# Patient Record
Sex: Male | Born: 1955
Health system: Southern US, Community
[De-identification: ages and names within clinical notes are randomized; demographics above are authoritative.]

## PROBLEM LIST (undated history)

## (undated) DIAGNOSIS — I10 Essential (primary) hypertension: Secondary | ICD-10-CM

## (undated) HISTORY — DX: Essential (primary) hypertension: I10

## (undated) HISTORY — PX: OTHER SURGICAL HISTORY: SHX169

---

## 2007-02-08 ENCOUNTER — Ambulatory Visit: Payer: Self-pay | Admitting: Cardiology

## 2007-02-08 ENCOUNTER — Observation Stay (HOSPITAL_COMMUNITY): Admission: EM | Admit: 2007-02-08 | Discharge: 2007-02-09 | Payer: Self-pay | Admitting: Emergency Medicine

## 2007-02-09 ENCOUNTER — Ambulatory Visit: Payer: Self-pay | Admitting: Cardiology

## 2007-02-15 ENCOUNTER — Ambulatory Visit: Payer: Self-pay

## 2007-02-22 ENCOUNTER — Ambulatory Visit: Payer: Self-pay

## 2007-02-24 ENCOUNTER — Ambulatory Visit: Payer: Self-pay | Admitting: Cardiology

## 2007-02-24 LAB — CONVERTED CEMR LAB
CO2: 30 meq/L (ref 19–32)
Chloride: 100 meq/L (ref 96–112)
Creatinine, Ser: 1 mg/dL (ref 0.4–1.5)
Glucose, Bld: 108 mg/dL — ABNORMAL HIGH (ref 70–99)
Sodium: 138 meq/L (ref 135–145)

## 2007-05-19 ENCOUNTER — Ambulatory Visit: Payer: Self-pay | Admitting: Internal Medicine

## 2007-05-19 DIAGNOSIS — I10 Essential (primary) hypertension: Secondary | ICD-10-CM | POA: Insufficient documentation

## 2007-08-19 ENCOUNTER — Ambulatory Visit: Payer: Self-pay | Admitting: Gastroenterology

## 2007-09-01 ENCOUNTER — Encounter: Payer: Self-pay | Admitting: Internal Medicine

## 2007-09-01 ENCOUNTER — Ambulatory Visit: Payer: Self-pay | Admitting: Gastroenterology

## 2007-09-01 ENCOUNTER — Encounter: Payer: Self-pay | Admitting: Gastroenterology

## 2007-12-21 ENCOUNTER — Ambulatory Visit: Payer: Self-pay | Admitting: Internal Medicine

## 2007-12-21 LAB — CONVERTED CEMR LAB
ALT: 31 units/L (ref 0–53)
AST: 23 units/L (ref 0–37)
Basophils Absolute: 0 10*3/uL (ref 0.0–0.1)
Basophils Relative: 0.4 % (ref 0.0–1.0)
Bilirubin, Direct: 0.1 mg/dL (ref 0.0–0.3)
Blood in Urine, dipstick: NEGATIVE
CO2: 29 meq/L (ref 19–32)
Chloride: 100 meq/L (ref 96–112)
Cholesterol: 117 mg/dL (ref 0–200)
Creatinine, Ser: 1.1 mg/dL (ref 0.4–1.5)
Ketones, urine, test strip: NEGATIVE
LDL Cholesterol: 53 mg/dL (ref 0–99)
Lymphocytes Relative: 31.8 % (ref 12.0–46.0)
MCHC: 34.6 g/dL (ref 30.0–36.0)
Neutrophils Relative %: 57.4 % (ref 43.0–77.0)
Nitrite: NEGATIVE
PSA: 0.5 ng/mL (ref 0.10–4.00)
Protein, U semiquant: NEGATIVE
RBC: 4.78 M/uL (ref 4.22–5.81)
Total Bilirubin: 0.9 mg/dL (ref 0.3–1.2)
Total CHOL/HDL Ratio: 2.8
Urobilinogen, UA: 0.2
VLDL: 22 mg/dL (ref 0–40)
WBC: 5.6 10*3/uL (ref 4.5–10.5)

## 2007-12-28 ENCOUNTER — Ambulatory Visit: Payer: Self-pay | Admitting: Internal Medicine

## 2008-01-24 IMAGING — CT CT ANGIO CHEST
3 of 9 series · 15 of 36 positions shown · IV contrast (APPLIED)
Comparison: none

CLINICAL DATA: Chest pain, hypertension

[Series 7: pulm embolism 1.0 b25f thins · axial · 0.74mm/px · z∈[+1090,+1300]mm · 12 of 247 slices shown (1 of 2)]
[im 19/247  lung]
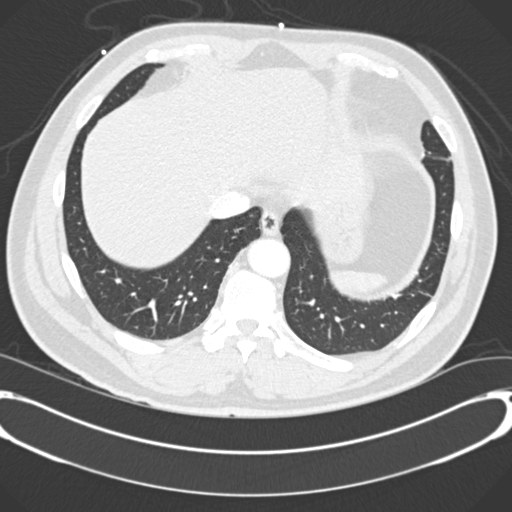
[im 38/247  mediastinal]
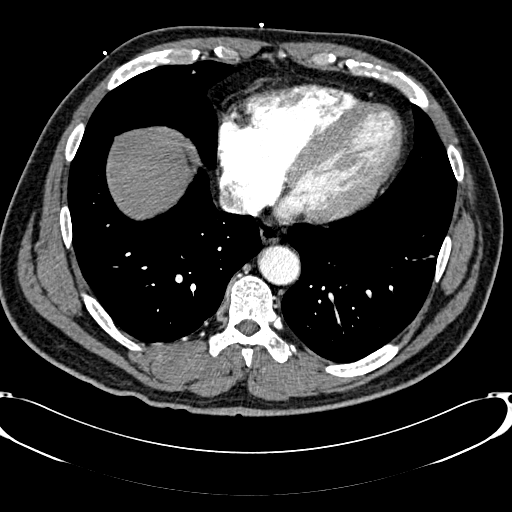
[im 57/247  lung]
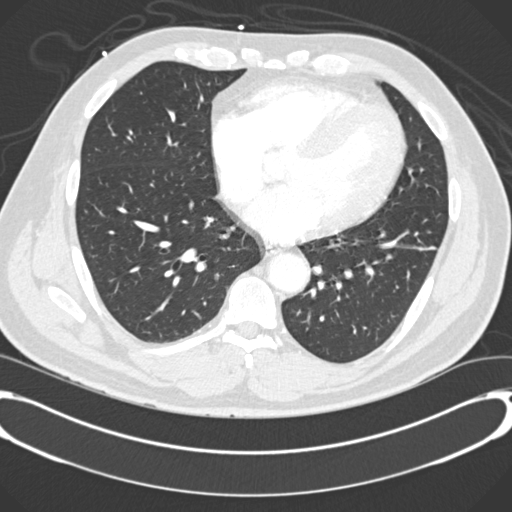
[im 76/247  mediastinal]
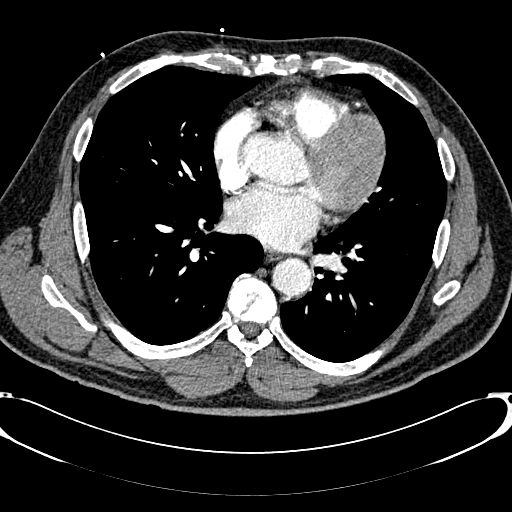
[im 95/247  lung]
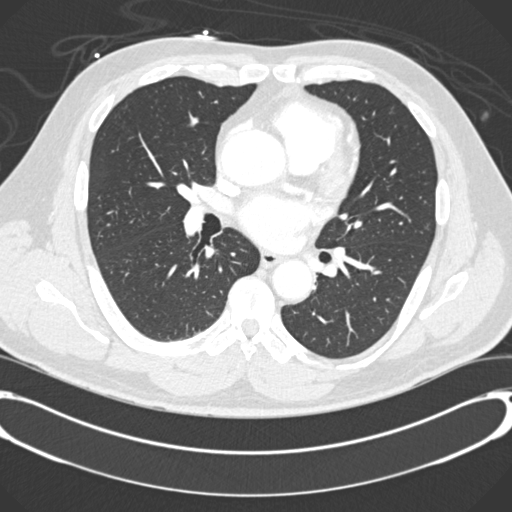
[im 114/247  mediastinal]
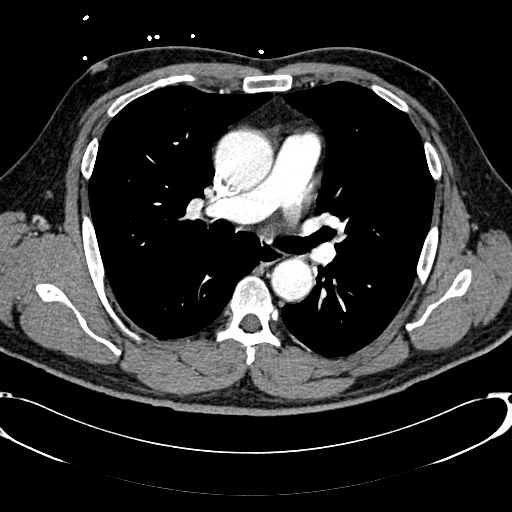
[im 133/247  lung]
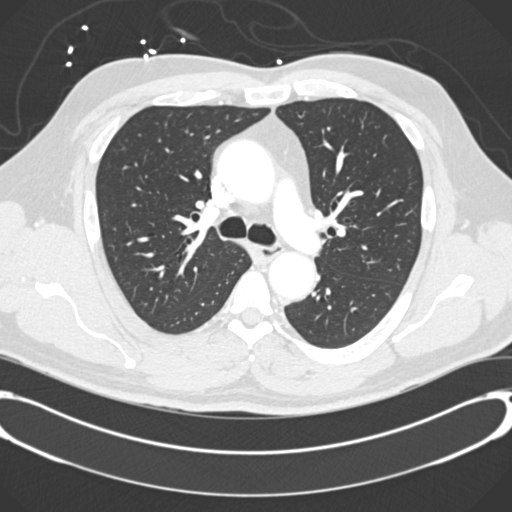
[im 152/247  mediastinal]
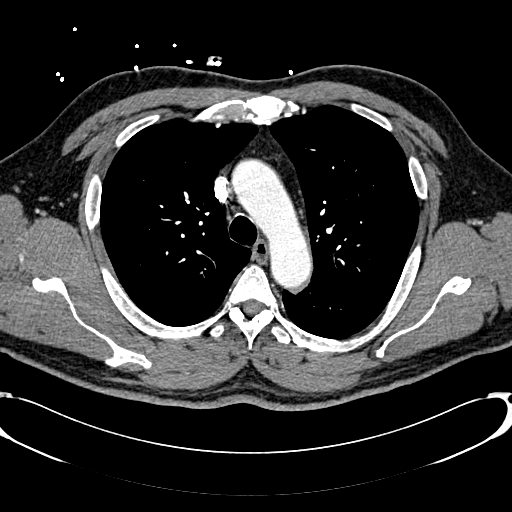
[im 171/247  lung]
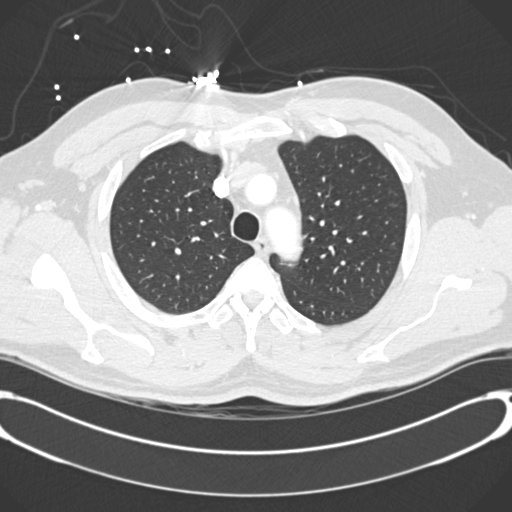
[im 190/247  mediastinal]
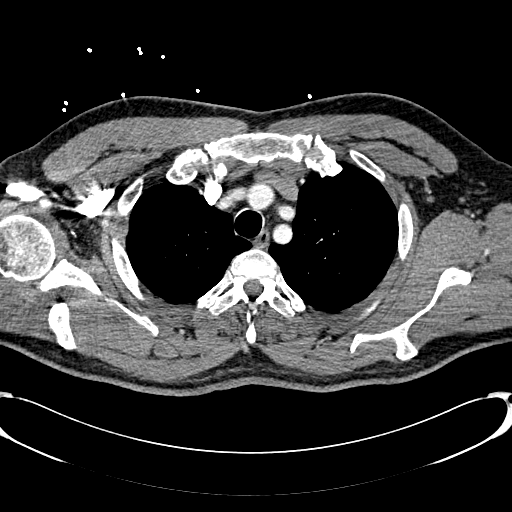
[im 209/247  lung]
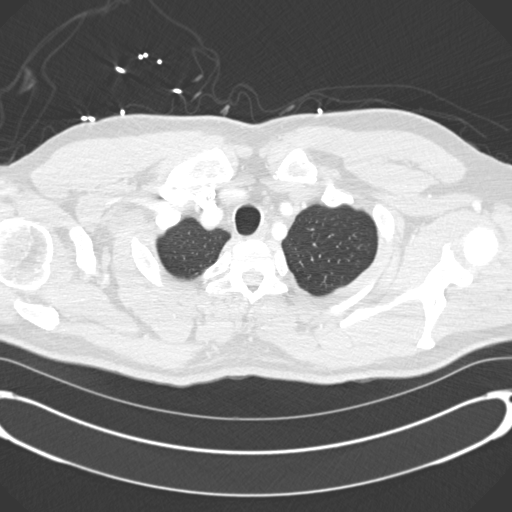
[im 228/247  mediastinal]
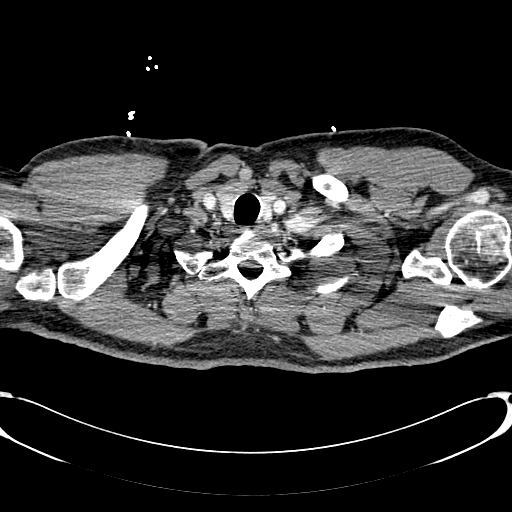

[Series 9: pulm embolism 1.0 b25f thins · axial · 0.74mm/px · z∈[+1062,+1084]mm · 2 of 68 slices shown (2 of 2)]
[im 23/68  lung]
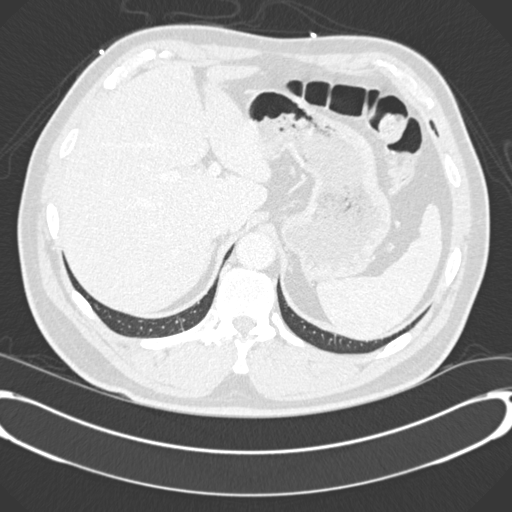
[im 45/68  lung]
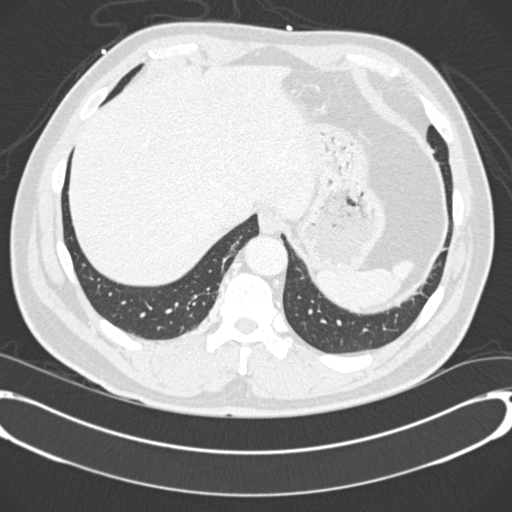

[Series 602: coronal · coronal · 0.74mm/px · 1 of 123 slices shown]
[im 62/123  mediastinal]
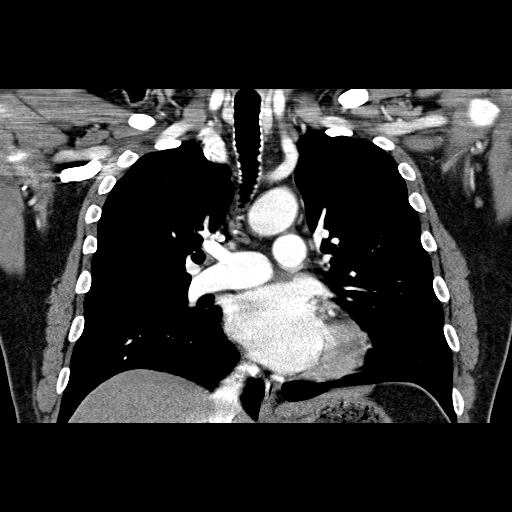

[15 of 36 positions shown; findings below may reference images not displayed]

CT angiogram chest with contrast:

Multidetector helical CT of the chest was obtained after 100 ml Omnipaque 300 
IV. CT multiplanar reconstructions were rendered to evaluate the vascular
anatomy.
No previous for comparison. Good contrast opacification of pulmonary artery
branches. No discrete filling defects to suggest acute PE. Good enhancement of
the thoracic aorta, without evidence of dissection or aneurysm. Ascending aorta
4.3 x 4.6 cm. No pleural or pericardial effusion. Proximal brachiocephalic
vessels unremarkable. No hilar or mediastinal adenopathy. Linear scarring or
atelectasis in the lateral basal segment left lower lobe. Lungs otherwise clear.
IMPRESSION: 1. Negative for acute PE or thoracic aortic dissection.
2. Linear scar or atelectasis in the left lower lobe.

## 2009-01-30 ENCOUNTER — Telehealth: Payer: Self-pay | Admitting: Internal Medicine

## 2009-03-08 ENCOUNTER — Ambulatory Visit: Payer: Self-pay | Admitting: Family Medicine

## 2009-03-13 ENCOUNTER — Ambulatory Visit: Payer: Self-pay | Admitting: Internal Medicine

## 2009-03-13 LAB — CONVERTED CEMR LAB
AST: 27 units/L (ref 0–37)
BUN: 18 mg/dL (ref 6–23)
Basophils Absolute: 0.1 10*3/uL (ref 0.0–0.1)
Bilirubin Urine: NEGATIVE
Blood in Urine, dipstick: NEGATIVE
CO2: 26 meq/L (ref 19–32)
Calcium: 9.6 mg/dL (ref 8.4–10.5)
Cholesterol: 110 mg/dL (ref 0–200)
Eosinophils Absolute: 0.2 10*3/uL (ref 0.0–0.7)
GFR calc non Af Amer: 93.16 mL/min (ref >60)
Glucose, Bld: 100 mg/dL — ABNORMAL HIGH (ref 70–99)
Glucose, Urine, Semiquant: NEGATIVE
HCT: 46.6 % (ref 39.0–52.0)
HDL: 49.1 mg/dL (ref >39.00)
Lymphs Abs: 2.1 10*3/uL (ref 0.7–4.0)
MCHC: 34.2 g/dL (ref 30.0–36.0)
Monocytes Relative: 7.9 % (ref 3.0–12.0)
PSA: 0.76 ng/mL (ref 0.10–4.00)
Platelets: 189 10*3/uL (ref 150.0–400.0)
Potassium: 4 meq/L (ref 3.5–5.1)
Protein, U semiquant: NEGATIVE
RDW: 12.4 % (ref 11.5–14.6)
TSH: 0.76 microintl units/mL (ref 0.35–5.50)
Total Bilirubin: 1.1 mg/dL (ref 0.3–1.2)
VLDL: 9.8 mg/dL (ref 0.0–40.0)
pH: 5

## 2009-03-20 ENCOUNTER — Ambulatory Visit: Payer: Self-pay | Admitting: Internal Medicine

## 2010-01-15 ENCOUNTER — Ambulatory Visit: Payer: Self-pay | Admitting: Internal Medicine

## 2010-01-18 LAB — CONVERTED CEMR LAB
ALT: 29 units/L (ref 0–53)
AST: 26 units/L (ref 0–37)
BUN: 17 mg/dL (ref 6–23)
Basophils Relative: 0.7 % (ref 0.0–3.0)
Chloride: 106 meq/L (ref 96–112)
Eosinophils Relative: 2 % (ref 0.0–5.0)
GFR calc non Af Amer: 80.38 mL/min (ref >60)
HCT: 45.1 % (ref 39.0–52.0)
Hemoglobin: 15.9 g/dL (ref 13.0–17.0)
Lymphs Abs: 1.6 10*3/uL (ref 0.7–4.0)
MCV: 92.9 fL (ref 78.0–100.0)
Monocytes Absolute: 0.5 10*3/uL (ref 0.1–1.0)
Monocytes Relative: 8 % (ref 3.0–12.0)
Neutro Abs: 4.2 10*3/uL (ref 1.4–7.7)
Potassium: 4.5 meq/L (ref 3.5–5.1)
RBC: 4.86 M/uL (ref 4.22–5.81)
Sed Rate: 3 mm/hr (ref 0–22)
Sodium: 141 meq/L (ref 135–145)
TSH: 1.13 microintl units/mL (ref 0.35–5.50)
Total Protein: 7.3 g/dL (ref 6.0–8.3)
WBC: 6.5 10*3/uL (ref 4.5–10.5)

## 2010-01-28 ENCOUNTER — Telehealth (INDEPENDENT_AMBULATORY_CARE_PROVIDER_SITE_OTHER): Payer: Self-pay | Admitting: *Deleted

## 2010-01-30 ENCOUNTER — Telehealth: Payer: Self-pay | Admitting: Internal Medicine

## 2010-01-30 LAB — CONVERTED CEMR LAB
LH: 2.94 milliintl units/mL (ref 1.50–9.30)
Prolactin: 5.8 ng/mL

## 2010-03-21 ENCOUNTER — Ambulatory Visit: Payer: Self-pay | Admitting: Internal Medicine

## 2010-03-21 DIAGNOSIS — E291 Testicular hypofunction: Secondary | ICD-10-CM | POA: Insufficient documentation

## 2010-06-13 ENCOUNTER — Ambulatory Visit: Payer: Self-pay | Admitting: Internal Medicine

## 2010-06-13 LAB — CONVERTED CEMR LAB: PSA: 0.58 ng/mL (ref 0.10–4.00)

## 2010-06-26 ENCOUNTER — Ambulatory Visit: Payer: Self-pay | Admitting: Internal Medicine

## 2010-08-06 NOTE — Assessment & Plan Note (Signed)
Summary: cpx/pt will come in fasting/njr   Vital Signs:  Patient profile:   55 year old male Height:      66.25 inches Weight:      205 pounds BMI:     32.96 Temp:     98.6 degrees F oral BP sitting:   168 / 96  (left arm) Cuff size:   regular  Vitals Entered By: Kern Reap CMA Duncan Dull) (March 21, 2010 7:59 AM)  Procedure Note Last Tetanus: Tdap (12/28/2007)  Skin Tag Removal:  Procedure # 1: skin tag removal    Region: inner thigh    # lesions removed: 1    Anesthesia: 1% lidocaine w/epinephrine    Comment: verbal consent  CC: cpx Is Patient Diabetic? No Pain Assessment Patient in pain? no        CC:  cpx.  History of Present Illness: cpx  Current Problems (verified): 1)  Preventive Health Care  (ICD-V70.0) 2)  Hypertension  (ICD-401.9)  Current Medications (verified): 1)  Adult Aspirin Low Strength 81 Mg  Tbdp (Aspirin) .... One By Mouth Daily 2)  Hydrochlorothiazide 25 Mg  Tabs (Hydrochlorothiazide) .... One By Mouth Daily 3)  Klor-Con M10 10 Meq  Tbcr (Potassium Chloride Crys Cr) .... One By Mouth Daily 4)  Ramipril 10 Mg  Caps (Ramipril) .... One By Mouth Daily 5)  Multivitamins  Tabs (Multiple Vitamin) .... Once Daily  Allergies: 1)  ! Penicillin V Potassium (Penicillin V Potassium)  Past History:  Past Medical History: Last updated: 05/19/2007 Hypertension  Past Surgical History: Last updated: 05/19/2007 Denies surgical history  Family History: Last updated: 03/20/2009 father - CABGx2 (smoker), lipids, AICD, DM (eventually deceased dementia age 27 yo) Family History Diabetes 1st degree relative Family History Hypertension mother with DM, htn  Social History: Last updated: 05/19/2007 Regular exercise-yes Never Smoked Alcohol use-yes Occupation:-[employer] Married 2nd wife 2 kids-healthy  Risk Factors: Alcohol Use: <1 (05/19/2007) Exercise: yes (05/19/2007)  Risk Factors: Smoking Status: never  (05/19/2007)  Review of Systems       Flu Vaccine Consent Questions     Do you have a history of severe allergic reactions to this vaccine? no    Any prior history of allergic reactions to egg and/or gelatin? no    Do you have a sensitivity to the preservative Thimersol? no    Do you have a past history of Guillan-Barre Syndrome? no    Do you currently have an acute febrile illness? no    Have you ever had a severe reaction to latex? no    Vaccine information given and explained to patient? yes    Are you currently pregnant? no    Lot Number:[id number]   Exp Date:01/04/2011   Site Given  Left Deltoid IM   Physical Exam  General:  alert and well-developed.   Head:  normocephalic and atraumatic.   Eyes:  pupils equal and pupils round.   Ears:  R ear normal and L ear normal.   Nose:  no external deformity and no external erythema.   Mouth:  pharynx pink and moist.   Neck:  supple and full ROM.   Chest Wall:  No deformities, masses, tenderness or gynecomastia noted. Lungs:  normal respiratory effort and no intercostal retractions.   Heart:  normal rate and regular rhythm.   Abdomen:  soft and non-tender.   Rectal:  no external abnormalities and normal sphincter tone.   Prostate:  no nodules and no asymmetry.   Msk:  No  deformity or scoliosis noted of thoracic or lumbar spine.   Extremities:  No clubbing, cyanosis, edema, or deformity noted  Neurologic:  cranial nerves II-XII intact and gait normal.   Skin:  large skin tag---iner thigh Cervical Nodes:  no anterior cervical adenopathy and no posterior cervical adenopathy.   Psych:  normally interactive and good eye contact.     Impression & Recommendations:  Problem # 1:  PREVENTIVE HEALTH CARE (ICD-V70.0) health maint UTD  Problem # 2:  HYPERTENSION (ICD-401.9) HOme BPs are well controlled 120-130/70s His updated medication list for this problem includes:    Hydrochlorothiazide 25 Mg Tabs (Hydrochlorothiazide) ..... One by  mouth daily    Ramipril 10 Mg Caps (Ramipril) ..... One by mouth daily  BP today: 168/96 Prior BP: 164/98 (01/15/2010)  Labs Reviewed: K+: 4.5 (01/15/2010) Creat: : 1.0 (01/15/2010)   Chol: 110 (03/13/2009)   HDL: 49.10 (03/13/2009)   LDL: 51 (03/13/2009)   TG: 49.0 (03/13/2009)  Problem # 3:  TESTICULAR HYPOFUNCTION (ICD-257.2) Assessment: New low testsosterone discussed replacement  Problem # 4:  skin tag Assessment: New  Complete Medication List: 1)  Adult Aspirin Low Strength 81 Mg Tbdp (Aspirin) .... One by mouth daily 2)  Hydrochlorothiazide 25 Mg Tabs (Hydrochlorothiazide) .... One by mouth daily 3)  Klor-con M10 10 Meq Tbcr (Potassium chloride crys cr) .... One by mouth daily 4)  Ramipril 10 Mg Caps (Ramipril) .... One by mouth daily 5)  Multivitamins Tabs (Multiple vitamin) .... Once daily 6)  Androgel Pump 20.25 Mg/act (1.62%) Gel (Testosterone) .... 2 pumps applied to shoulders daily  Other Orders: Admin 1st Vaccine (16109) Flu Vaccine 53yrs + (60454) Removal of Skin Tags (11200)   Patient Instructions: 1)  3 months labs only 2)  testosterone, psa prior Prescriptions: RAMIPRIL 10 MG  CAPS (RAMIPRIL) one by mouth daily  #90 x 0   Entered and Authorized by:   Birdie Sons MD   Signed by:   Birdie Sons MD on 03/21/2010   Method used:   [facility]  [place] [address] [facility]       [address]       [place], [place]  [address]       Ph: [phone] or [phone]       Fax: [phone]   RxID:   [id number] KLOR-CON M10 10 MEQ  TBCR (POTASSIUM CHLORIDE CRYS CR) one by mouth daily  #90 x 0   Entered and Authorized by:   Birdie Sons MD   Signed by:   Birdie Sons MD on 03/21/2010   Method used:   [facility]  [place] [address] [facility]       [address]       [place], [place]  [address]       Ph: [phone] or [phone]       Fax: [phone]   RxID:   [id number] HYDROCHLOROTHIAZIDE 25 MG  TABS  (HYDROCHLOROTHIAZIDE) one by mouth daily  #90 x 0   Entered and Authorized by:   Birdie Sons MD   Signed by:   Birdie Sons MD on 03/21/2010   Method used:   [facility]  [place] [address] [facility]       [address]       [place], [place]  [address]  Ph: [phone] or [phone]       Fax: [phone]   RxID:   [id number] ANDROGEL PUMP 20.25 MG/ACT (1.62%) GEL (TESTOSTERONE) 2 pumps applied to shoulders daily  #3 bottles x 1   Entered and Authorized by:   Birdie Sons MD   Signed by:   Birdie Sons MD on 03/21/2010   Method used:   Print then Give to Patient   RxID:   [id number]

## 2010-08-06 NOTE — Assessment & Plan Note (Signed)
Summary: MEDICATION CONCERNS // RS   Vital Signs:  Patient profile:   55 year old male Height:      66.5 inches (168.91 cm) Weight:      207.31 pounds (94.23 kg) BMI:     33.08 Temp:     98.8 degrees F (37.11 degrees C) oral Pulse rate:   88 / minute BP sitting:   164 / 98  (left arm) Cuff size:   regular  Vitals Entered By: Josph Macho RMA (January 15, 2010 9:54 AM) CC: Medication concern - ringing in ears, moody, joints achy/ CF Is Patient Diabetic? No   CC:  Medication concern - ringing in ears, moody, and joints achy/ CF.  History of Present Illness: pt  concerned with several unrelated issues notes some ringing in ears for 6 weeks wife states that his mood has been different. seems more distant and melancholy. couldn't enjoy a vacation at [facility] sex drive diminished also has noted slight chest 'blips" since mid-june. very brief episodes of a "sensation in my chest". No pain, no SOB. he has no exertional sxs.   he thinks a lot of his sxs, especially, "blips" are much better after exercise.   HTN--has been on meds for at least 2 years without previous side effects. Home BPs 120s/60s-130/80s  Current Medications (verified): 1)  Adult Aspirin Low Strength 81 Mg  Tbdp (Aspirin) .... One By Mouth Daily 2)  Hydrochlorothiazide 25 Mg  Tabs (Hydrochlorothiazide) .... One By Mouth Daily 3)  Klor-Con M10 10 Meq  Tbcr (Potassium Chloride Crys Cr) .... One By Mouth Daily 4)  Ramipril 10 Mg  Caps (Ramipril) .... One By Mouth Daily 5)  Multivitamins  Tabs (Multiple Vitamin) .... Once Daily  Allergies (verified): 1)  ! Penicillin V Potassium (Penicillin V Potassium)  Past History:  Past Medical History: Last updated: 05/19/2007 Hypertension  Past Surgical History: Last updated: 05/19/2007 Denies surgical history  Family History: Last updated: 03/20/2009 father - CABGx2 (smoker), lipids, AICD, DM (eventually deceased dementia age 73 yo) Family History Diabetes 1st  degree relative Family History Hypertension mother with DM, htn  Social History: Last updated: 05/19/2007 Regular exercise-yes Never Smoked Alcohol use-yes Occupation:-[employer] Married 2nd wife 2 kids-healthy  Risk Factors: Alcohol Use: <1 (05/19/2007) Exercise: yes (05/19/2007)  Risk Factors: Smoking Status: never (05/19/2007)  Physical Exam  General:  alert and well-developed.   Head:  normocephalic and atraumatic.   Neck:  supple and full ROM.   Lungs:  normal respiratory effort and no intercostal retractions.   Heart:  short 2/6 SEM Abdomen:  soft and non-tender.     Impression & Recommendations:  Problem # 1:  FATIGUE (ICD-780.79) ? cause, needs further eval check labs Orders: Venipuncture (57846) TLB-BMP (Basic Metabolic Panel-BMET) (80048-METABOL) TLB-CBC Platelet - w/Differential (85025-CBCD) TLB-Hepatic/Liver Function Pnl (80076-HEPATIC) TLB-TSH (Thyroid Stimulating Hormone) (84443-TSH) TLB-Sedimentation Rate (ESR) (85652-ESR) TLB-Testosterone, Total (84403-TESTO)  Complete Medication List: 1)  Adult Aspirin Low Strength 81 Mg Tbdp (Aspirin) .... One by mouth daily 2)  Hydrochlorothiazide 25 Mg Tabs (Hydrochlorothiazide) .... One by mouth daily 3)  Klor-con M10 10 Meq Tbcr (Potassium chloride crys cr) .... One by mouth daily 4)  Ramipril 10 Mg Caps (Ramipril) .... One by mouth daily 5)  Multivitamins Tabs (Multiple vitamin) .... Once daily

## 2010-08-06 NOTE — Progress Notes (Signed)
  Phone Note Outgoing Call   Call placed by: Rita Ohara Call placed to: Patient Summary of Call: Left a message with patient to call back. need to let him know he needs to come in to get prolactin FSH and LH redrawn. I faxed add on to [facility] on 01-16-10 and recived a confirmation back that the fax whent through but they never did the tests.

## 2010-08-06 NOTE — Progress Notes (Signed)
  Phone Note Call from Patient   Summary of Call: Patient called back and said he would come in today for labs to be drawn. Initial call taken by: Rita Ohara     Appended Document:  Patient came in and I sent the labs to [facility] STAT. I Elvina Sidle about this so she will be looking for results to notify patient.  Appended Document:  patient is aware that add on labs were normal.  patient would like to know what he should do next?  Appended Document:  call patient. testosterone low schedule non urgent OV to discuss replacement  Appended Document:  patient aware

## 2010-08-06 NOTE — Progress Notes (Signed)
Summary: Celexa CNCLD  Phone Note Call from Patient   Summary of Call: patient is calling because he has changed his mind about trying the celexa. Initial call taken by: Kern Reap CMA Duncan Dull),  January 30, 2010 9:53 AM  Follow-up for Phone Call        [facility] Follow-up by: Kern Reap CMA Duncan Dull),  January 30, 2010 9:54 AM

## 2010-08-06 NOTE — Progress Notes (Signed)
Summary: new rx and refills  Phone Note Outgoing Call   Call placed by: Kern Reap CMA Duncan Dull),  January 30, 2010 8:56 AM Call placed to: Patient Action Taken: Phone Call Completed Summary of Call: patient will need refills and rx for celexa called in until his cpx. Initial call taken by: Kern Reap CMA Duncan Dull),  January 30, 2010 8:56 AM    New/Updated Medications: CELEXA 10 MG TABS (CITALOPRAM HYDROBROMIDE) take one tab by mouth once daily Prescriptions: RAMIPRIL 10 MG  CAPS (RAMIPRIL) one by mouth daily  #90 x 0   Entered by:   Kern Reap CMA (AAMA)   Authorized by:   Birdie Sons MD   Signed by:   Kern Reap CMA (AAMA) on 01/30/2010   Method used:   [facility]  [place] [address] [facility]       [address]       [place], [place]  [address]       Ph: [phone] or [phone]       Fax: [phone]   RxID:   [id number] KLOR-CON M10 10 MEQ  TBCR (POTASSIUM CHLORIDE CRYS CR) one by mouth daily  #90 x 0   Entered by:   Kern Reap CMA (AAMA)   Authorized by:   Birdie Sons MD   Signed by:   Kern Reap CMA (AAMA) on 01/30/2010   Method used:   [facility]        [facility]  [place] [address] [facility]       [address]       [place], [place]  [address]       Ph: [phone] or [phone]       Fax: [phone]   RxID:   [id number] HYDROCHLOROTHIAZIDE 25 MG  TABS (HYDROCHLOROTHIAZIDE) one by mouth daily  #90 x 0   Entered by:   Kern Reap CMA (AAMA)   Authorized by:   Birdie Sons MD   Signed by:   Kern Reap CMA (AAMA) on 01/30/2010   Method used:   [facility]  [place] [address] [facility]       [address]       [place], [place]  [address]       Ph: [phone] or [phone]       Fax: [phone]   RxID:   [id number] CELEXA 10 MG TABS (CITALOPRAM HYDROBROMIDE) take one tab by mouth once daily  #30 x 3   Entered by:   Kern Reap CMA (AAMA)   Authorized by:   Birdie Sons MD   Signed by:   Kern Reap CMA (AAMA) on 01/30/2010   Method used:   [facility]        [facility]  [place] [address] [facility]       [address]       [place], [place]  [address]       Ph: [phone] or [phone]       Fax: [phone]   RxID:   [id number]

## 2010-08-08 NOTE — Assessment & Plan Note (Signed)
Summary: 3 month fup//ccm   Vital Signs:  Patient profile:   55 year old male Weight:      211 pounds Temp:     98.6 degrees F oral Pulse rate:   84 / minute Pulse rhythm:   regular BP sitting:   172 / 90  (left arm) Cuff size:   large  Vitals Entered By: Alfred Levins, CMA (June 26, 2010 8:06 AM) CC: discuss lab results   CC:  discuss lab results.  History of Present Illness:  Follow-Up Visit      This is a 55 year old man who presents for Follow-up visit.  The patient denies chest pain and palpitations.  Since the last visit the patient notes no new problems or concerns.  The patient reports taking meds as prescribed.  When questioned about possible medication side effects, the patient notes none. pt feels better on androgel  All other systems reviewed and were negative   Current Problems (verified): 1)  Testicular Hypofunction  (ICD-257.2) 2)  Preventive Health Care  (ICD-V70.0) 3)  Hypertension  (ICD-401.9)  Current Medications (verified): 1)  Adult Aspirin Low Strength 81 Mg  Tbdp (Aspirin) .... One By Mouth Daily 2)  Hydrochlorothiazide 25 Mg  Tabs (Hydrochlorothiazide) .... One By Mouth Daily 3)  Klor-Con M10 10 Meq  Tbcr (Potassium Chloride Crys Cr) .... One By Mouth Daily 4)  Ramipril 10 Mg  Caps (Ramipril) .... One By Mouth Daily 5)  Multivitamins  Tabs (Multiple Vitamin) .... Once Daily 6)  Androgel Pump 20.25 Mg/act (1.62%) Gel (Testosterone) .... 2 Pumps Applied To Shoulders Daily  Allergies (verified): 1)  ! Penicillin V Potassium (Penicillin V Potassium)  Past History:  Past Medical History: Last updated: 05/19/2007 Hypertension  Past Surgical History: Last updated: 05/19/2007 Denies surgical history  Family History: Last updated: 03/20/2009 father - CABGx2 (smoker), lipids, AICD, DM (eventually deceased dementia age 72 yo) Family History Diabetes 1st degree relative Family History Hypertension mother with DM, htn  Social History: Last  updated: 05/19/2007 Regular exercise-yes Never Smoked Alcohol use-yes Occupation:-[employer] Married 2nd wife 2 kids-healthy  Risk Factors: Alcohol Use: <1 (05/19/2007) Exercise: yes (05/19/2007)  Risk Factors: Smoking Status: never (05/19/2007)  Physical Exam  General:  alert and well-developed.   Head:  normocephalic and atraumatic.   Neck:  supple and full ROM.   Lungs:  normal respiratory effort and no intercostal retractions.   Skin:  turgor normal and color normal.   Psych:  normally interactive and good eye contact.     Impression & Recommendations:  Problem # 1:  TESTICULAR HYPOFUNCTION (ICD-257.2) see testosterone after a lot of discussion and phone calls it turns out he was given wong dose of androgel discussed with pt and pharmacy  repeat labs in 3 months  Problem # 2:  HYPERTENSION (ICD-401.9) he will monitor at home His updated medication list for this problem includes:    Hydrochlorothiazide 25 Mg Tabs (Hydrochlorothiazide) ..... One by mouth daily    Ramipril 10 Mg Caps (Ramipril) ..... One by mouth daily  BP today: 172/90 Prior BP: 168/96 (03/21/2010)  Labs Reviewed: K+: 4.5 (01/15/2010) Creat: : 1.0 (01/15/2010)   Chol: 110 (03/13/2009)   HDL: 49.10 (03/13/2009)   LDL: 51 (03/13/2009)   TG: 49.0 (03/13/2009)  Complete Medication List: 1)  Adult Aspirin Low Strength 81 Mg Tbdp (Aspirin) .... One by mouth daily 2)  Hydrochlorothiazide 25 Mg Tabs (Hydrochlorothiazide) .... One by mouth daily 3)  Klor-con M10 10 Meq Tbcr (  Potassium chloride crys cr) .... One by mouth daily 4)  Ramipril 10 Mg Caps (Ramipril) .... One by mouth daily 5)  Multivitamins Tabs (Multiple vitamin) .... Once daily 6)  Androgel Pump 20.25 Mg/act (1.62%) Gel (Testosterone) .... 2 pumps applied to shoulders daily  Patient Instructions: 1)  Please schedule a follow-up appointment in 3 months. labs only 2)  testosterone level--- Prescriptions: ANDROGEL PUMP 20.25  MG/ACT (1.62%) GEL (TESTOSTERONE) 2 pumps applied to shoulders daily  #3 bottles x 1   Entered and Authorized by:   Birdie Sons MD   Signed by:   Birdie Sons MD on 06/26/2010   Method used:   Reprint   RxID:   [id number]    Orders Added: 1)  Est. Patient Level IV [62[address]]

## 2010-09-09 ENCOUNTER — Other Ambulatory Visit: Payer: Self-pay | Admitting: *Deleted

## 2010-09-09 DIAGNOSIS — I10 Essential (primary) hypertension: Secondary | ICD-10-CM

## 2010-09-09 MED ORDER — POTASSIUM CHLORIDE 10 MEQ PO TBCR: 10.0000 meq | EXTENDED_RELEASE_TABLET | Freq: Every day | ORAL | Status: DC

## 2010-09-09 MED ORDER — HYDROCHLOROTHIAZIDE 25 MG PO TABS: 25.0000 mg | ORAL_TABLET | Freq: Every day | ORAL | Status: DC

## 2010-09-09 MED ORDER — RAMIPRIL 10 MG PO CAPS: 10.0000 mg | ORAL_CAPSULE | Freq: Every day | ORAL | Status: DC

## 2010-09-10 ENCOUNTER — Other Ambulatory Visit: Payer: Self-pay | Admitting: Internal Medicine

## 2010-09-10 DIAGNOSIS — I10 Essential (primary) hypertension: Secondary | ICD-10-CM

## 2010-09-10 MED ORDER — RAMIPRIL 10 MG PO CAPS: 10.0000 mg | ORAL_CAPSULE | Freq: Every day | ORAL | Status: DC

## 2010-09-10 MED ORDER — HYDROCHLOROTHIAZIDE 25 MG PO TABS: 25.0000 mg | ORAL_TABLET | Freq: Every day | ORAL | Status: DC

## 2010-09-10 MED ORDER — POTASSIUM CHLORIDE 10 MEQ PO TBCR: 10.0000 meq | EXTENDED_RELEASE_TABLET | Freq: Every day | ORAL | Status: DC

## 2010-09-25 ENCOUNTER — Other Ambulatory Visit (INDEPENDENT_AMBULATORY_CARE_PROVIDER_SITE_OTHER): Payer: PRIVATE HEALTH INSURANCE | Admitting: Internal Medicine

## 2010-09-25 DIAGNOSIS — E291 Testicular hypofunction: Secondary | ICD-10-CM

## 2010-09-25 LAB — TESTOSTERONE: Testosterone: 1057.23 ng/dL — ABNORMAL HIGH (ref 350.00–890.00)

## 2010-09-26 ENCOUNTER — Other Ambulatory Visit: Payer: Self-pay | Admitting: *Deleted

## 2010-09-26 DIAGNOSIS — E291 Testicular hypofunction: Secondary | ICD-10-CM

## 2010-09-26 MED ORDER — TESTOSTERONE 20.25 MG/ACT (1.62%) TD GEL: 2.0000 | Freq: Every day | TRANSDERMAL | Status: DC

## 2010-11-11 ENCOUNTER — Telehealth: Payer: Self-pay | Admitting: *Deleted

## 2010-11-11 MED ORDER — TADALAFIL 20 MG PO TABS: 20.0000 mg | ORAL_TABLET | Freq: Every day | ORAL | Status: AC | PRN

## 2010-11-11 NOTE — Telephone Encounter (Signed)
Pt is still having problems with ED, and wants to try one of the meds for this.  His BP is staying normal.  He is referring to Viagra, Cialis, etc.

## 2010-11-11 NOTE — Telephone Encounter (Signed)
cialis 20 mg po qod prn  30 minutes to 36 hours before intercourse.

## 2010-11-11 NOTE — Telephone Encounter (Signed)
Pt.notified

## 2010-11-19 NOTE — Discharge Summary (Signed)
NAMECASEY, FYE                 ACCOUNT NO.:  [id number]   MEDICAL RECORD NO.:  [id number]          PATIENT TYPE:  OBV   LOCATION:  4741                         FACILITY:  [facility]   PHYSICIAN:  Jonelle Sidle, MD DATE OF BIRTH:  February 22, 1956   DATE OF ADMISSION:  02/08/2007  DATE OF DISCHARGE:  02/09/2007                               DISCHARGE SUMMARY   Primary cardiologist, Dr. Simona Huh.   Primary care physician, [facility].   DISCHARGE DIAGNOSES:  1. Atypical chest pain.  2. Newly diagnosed hypertension.  3. Family history of coronary artery disease.  4. Normal left ventricular function, 55-60%, with mild to moderate      left ventricular hypertrophy by echocardiogram this admission.  5. Mild aortic root dilatation.      a.     Chest CT with contrast negative for dissection or aneurysm       or pulmonary embolus.   HISTORY:  Mr. Wallen is a 55 year old male patient with essentially  negative past medical history who presented to [facility]  emergency room the date of admission with complaints of atypical chest  discomfort.  He had gone to an urgent care center that noticed his blood  pressure was elevated at 210/110.  He was admitted to [facility] for further evaluation and treatment.   HOSPITAL COURSE:  The patient ruled out for myocardial infarction by  enzymes.  He did undergo an echocardiogram that revealed good LV  function with mild to moderate LVH.  No wall motion abnormalities were  noted.  There was mild aortic root dilatation and some linear shadowing  in the aortic arch of uncertain significance.  Therefore, a chest CT was  ordered.  This revealed no acute pulmonary embolus or dissection.  There  was linear scarring or atelectasis in the left lower lobe.  The chest CT  was also reviewed with one of the radiologists who noted that there was  somewhat prominence of the ascending aorta but no definite aneurysm.  The patient  was placed on hydrochlorothiazide and potassium  supplementation as well as Altace for blood pressure control.  He was  set up for an outpatient stress Myoview study and followup with Dr.  Diona Browner.  He was symptom free on the day of discharge.   LABS AND ANCILLARY DATA:  White count 7900, hemoglobin 15.8, hematocrit  45.9, MCV 91.4, platelet count 211,000.  Sodium 139, potassium 4,  glucose 100, BUN 12, creatinine 0.92.  Cardiac markers negative x3.  Total cholesterol 99, triglycerides 46, HDL 49, LDL 41.  Chest x-ray  from admission, no acute findings.  Chest CT as noted above.   DISCHARGE MEDICATIONS:  1. Hydrochlorothiazide 25 mg a day.  2. K-Dur 10 mEq daily.  3. Altace 5 mg daily.   DIET:  Low-fat, low-sodium diet.   ACTIVITIES:  He is to increase his activity slowly.   Wound care is not applicable.   FOLLOWUP:  The patient is scheduled for a stress Myoview study 08/18 at  9:45 a.m.  He will follow up with Dr.  McDowell 09/05 at 11:15 a.m.   Total physician PA time greater than 30 minutes on this discharge.      Tereso Newcomer, PA-C      Jonelle Sidle, MD  Electronically Signed    SW/MEDQ  D:  02/09/2007  T:  02/10/2007  Job:  [id number]   cc:   [facility]

## 2010-11-19 NOTE — Assessment & Plan Note (Signed)
[facility]                            CARDIOLOGY OFFICE NOTE   NAME:Michalski, Sivan                          MRN:          [id number]  DATE:02/24/2007                            DOB:          11-Feb-1956    REASON FOR VISIT:  Follow up cardiac testing.   HISTORY OF PRESENT ILLNESS:  I saw Mr. Heims during a recent hospital  admission in early August with newly diagnosed hypertension and atypical  chest pain. He rule out for myocardial infarction and underwent an  echocardiogram demonstrating left ventricular systolic function. He did  have a mildly increased aortic root size and had a CT scan of the chest  demonstrating no significant aneurysm or dissection. He was placed on  hydrochlorothiazide and Altace and scheduled for an outpatient Myoview  which demonstrated no evidence of scar or ischemia with a normal  ejection fraction of 58%.   I have reviewed the information with the patient today. He is active and  has been looking at his diet more carefully, specifically trying to  limit the salt. He has not had a regular primary care physician, and we  talked about establishing for routine physicals with a physician in our  practice. He lives out in [place].   ALLERGIES:  PENICILLIN.   PRESENT MEDICATIONS:  1. Aspirin 81 mg p.o. daily.  2. Altace 10 mg p.o. daily.  3. Hydrochlorothiazide 25 mg p.o. daily.  4. Potassium 10 mEq p.o. daily.   REVIEW OF SYSTEMS:  As in history of present illness. Otherwise  negative.   PHYSICAL EXAMINATION:  Blood pressure today 153/97, heart rate initially  110 although down from 90s ultimately. The patient reported he was very  anxious about receiving the test results. His weight is 198 pounds. He  is normally nourished, no acute distress.  Examination of the neck shows no elevated jugular venous pressure. No  bruits.  LUNGS:  Clear without labored breathing.  CARDIAC EXAM:  Regular rate and rhythm. No loud  murmur or gallop.  EXTREMITIES:  Showed no pitting edema.   IMPRESSION:  1. Hypertension, likely essential. We will plan to continue the      present regimen with a followup BMET. I would like to establish Mr.      Kedzierski with Dr. Cato Mulligan in our [place] office close to the      patient's home. He will be scheduled for a new patient visit in      October for routine physical and again have further followup from      there. At this point, I do not anticipate any      additional cardiac evaluation.  2. Cardiology followup will be p.r.n.     Jonelle Sidle, MD  Electronically Signed    SGM/MedQ  DD: 02/24/2007  DT: 02/25/2007  Job #: [id number]

## 2010-11-19 NOTE — H&P (Signed)
Douglas Delgado, Douglas Delgado                 ACCOUNT NO.:  [id number]   MEDICAL RECORD NO.:  [id number]          PATIENT TYPE:  EMS   LOCATION:  [facility]                         FACILITY:  [facility]   PHYSICIAN:  Jonelle Sidle, MD DATE OF BIRTH:  1956/06/01   DATE OF ADMISSION:  02/08/2007  DATE OF DISCHARGE:                              HISTORY & PHYSICAL   PRIMARY CARE PHYSICIAN:  None.   PRIMARY CARDIOLOGIST:  Will be new, Dr. Simona Huh.   HISTORY OF PRESENT ILLNESS:  This is a 55 year old Caucasian male with  no prior cardiac history with complaints of left-sided chest discomfort  on and off x6 days, described as a fleeting, twinging feeling, lasting 2-  3 seconds.  Not associated with activity.  He also has some twining and  warmth in his left thigh.  This has been occurring more frequently, but  without associated dizziness, shortness of breath, diarrhea, nausea or  vomiting.  He states that he notices it more when he is hungry and it  gets some better after eating.  Of note, the patient has had a  periodontal appointment within the last week and was told he was  hypertensive.  They chalked it up to white coat syndrome of being in the  dentist's office.  The patient also is under a lot of stress secondary  to his father's chronic illnesses.   The patient's secondary continuation of the left-sided chest discomfort,  saw an urgent care physician this morning at [facility]  and was found to be hypertensive with a blood pressure of 210/110 in the  office and mildly tachycardic with a heart rate of around 100.  EKG did  not show any acute ST/T wave abnormalities, but secondary to the  hypertension the patient was advised to come to the emergency room.   The patient, in the emergency room, is without discomfort at this time.  His blood pressure has come down some at 166/93, pulse 82, respirations  12 with a temperature of 98.8.  The patient states that he is very  active.   He works out at [employer] 2-3 times a week working on the  elliptical for approximately 45 minutes without associated symptoms.  The patient also states that he takes the stairs at work and he has had  no associated symptoms.   PAST MEDICAL HISTORY:  None.   PAST SURGICAL HISTORY:  None.   SOCIAL HISTORY:  The patient lives in [place] with his wife.  He is  an Airline pilot.  He does not smoke.  Rare use of alcohol.  He is very  active, works out 2-3 times a week at [employer].  He does a lot of his  own yard work and walks a lot.   FAMILY HISTORY:  Mother with hypertension and diabetes.  Father with  myocardial infarction, CABG with redo 10 years later, diabetes and  tobacco abuse.  He has 1 brother who is in good health.   CURRENT MEDICATIONS AT HOME:  A multivitamin daily.   CURRENT ALLERGIES:  PENICILLIN.   REVIEW  OF SYSTEMS:  Positive for chest discomfort, which she describes  as twinge like and fleeting.  Denies any other associated symptoms.  All  other systems as described above and are negative.   CURRENT LABS:  Hemoglobin 16.0, hematocrit 47.0, sodium 139, potassium  4.9, chloride 105, BUN 13, creatinine 1.0, glucose 105.  Point of care:  Troponin less than 0.05, CK-MB 1.5, myoglobin 93.3.  EKG revealing  normal sinus rhythm with a ventricular rate of 81 beats per minute.  Chest x-ray reveals normal.   PHYSICAL EXAMINATION:  GENERAL:  He is awake, alert and oriented without  acute distress.  HEENT:  Head is normocephalic, atraumatic.  Eyes:  PERRLA.  Mucous  membranes and mouth, pink and moist.  Tongue is midline.  NECK:  Supple.  There is no JVD.  There is no carotid bruits  appreciated.  CARDIOVASCULAR:  Regular rate and rhythm with a soft S4 murmur  auscultated without rubs or gallops.  Pulses are 2+ and equally  bilaterally.  LUNGS:  Clear to auscultation.  ABDOMEN:  Soft, nontender.  No rebound or guarding is noted.  EXTREMITIES:  Without clubbing, cyanosis or  edema.  NEURO:  Cranial nerves II-XII are grossly intact.   CARDIOVASCULAR RISK FACTORS:  Hypertension, age, male, family history  and unknown cholesterol status.   IMPRESSION:  1. Hypertension.  2. Atypical chest discomfort.   PLAN:  The patient has been seen and examined by Dr. Simona Huh and  myself in the emergency room, is a 55 year old Caucasian male recently  noted to be hypertensive with a blood pressure of 225/110 with a  positive family history of coronary artery disease and uncertain lipids.  He has had recurrent brief, atypical chest pain, not with exertion, but  over the last few days.  He more describes it as twinge, lasting a few  seconds.  Chest x-ray is normal.  Troponins are normal.  EKG with mild  increased voltage.  No regular physician and no regular medications.   We will admit, observe, telemetry, cycle cardiac enzymes and follow up  with EKG, lipid status, a 2D echocardiogram will be completed for left  ventricular function.  We will add HCTZ and potassium.  If  echocardiogram rules out wall motion abnormality, then we will consider  discharge with followup outpatient Myoview.  In the interim, the patient  will be monitored closely for response to blood pressure medications and  lab work.      Bettey Mare. Lyman Bishop, NP      Jonelle Sidle, MD  Electronically Signed    KML/MEDQ  D:  02/08/2007  T:  02/08/2007  Job:  [id number]

## 2010-12-25 ENCOUNTER — Other Ambulatory Visit (INDEPENDENT_AMBULATORY_CARE_PROVIDER_SITE_OTHER): Payer: PRIVATE HEALTH INSURANCE

## 2010-12-25 DIAGNOSIS — E291 Testicular hypofunction: Secondary | ICD-10-CM

## 2010-12-25 LAB — TESTOSTERONE: Testosterone: 332.04 ng/dL — ABNORMAL LOW (ref 350.00–890.00)

## 2010-12-31 ENCOUNTER — Telehealth: Payer: Self-pay | Admitting: Internal Medicine

## 2010-12-31 NOTE — Telephone Encounter (Signed)
Pt called with 2 issues. One - he had testosterone level labs last Wed. Still has not heard, and would like a call with the results. Two - he was prescribed Cialis. Took 4 doses. Pt states he had improvement, thought not 100% success. He did not like the way it made him feel (achy, headache, heartburn) -- wants to know if Dr. Cato Mulligan could let him try something else. [facility] is his pharm.

## 2011-01-02 ENCOUNTER — Telehealth: Payer: Self-pay | Admitting: *Deleted

## 2011-01-02 NOTE — Telephone Encounter (Signed)
Pt would like testosterone results and also the cialis prescribed was some success but it is giving flu like symptoms and he wants something different.  [facility]

## 2011-01-03 ENCOUNTER — Other Ambulatory Visit: Payer: Self-pay | Admitting: *Deleted

## 2011-01-03 DIAGNOSIS — E291 Testicular hypofunction: Secondary | ICD-10-CM

## 2011-01-03 MED ORDER — SILDENAFIL CITRATE 100 MG PO TABS: 100.0000 mg | ORAL_TABLET | ORAL | Status: DC | PRN

## 2011-01-03 MED ORDER — TESTOSTERONE 20.25 MG/ACT (1.62%) TD GEL: 3.0000 | Freq: Every day | TRANSDERMAL | Status: DC

## 2011-01-12 NOTE — Telephone Encounter (Signed)
Review the lab note Ok to d/c cialis and try viagra 100 mg 1/2-1 po 30 minutes prior to sexual intercourse #10/3

## 2011-01-13 NOTE — Telephone Encounter (Signed)
Pt aware.

## 2011-04-18 ENCOUNTER — Other Ambulatory Visit (INDEPENDENT_AMBULATORY_CARE_PROVIDER_SITE_OTHER): Payer: PRIVATE HEALTH INSURANCE

## 2011-04-18 DIAGNOSIS — Z0000 Encounter for general adult medical examination without abnormal findings: Secondary | ICD-10-CM

## 2011-04-18 LAB — HEPATIC FUNCTION PANEL
ALT: 21 U/L (ref 0–53)
AST: 21 U/L (ref 0–37)
Alkaline Phosphatase: 61 U/L (ref 39–117)
Bilirubin, Direct: 0.2 mg/dL (ref 0.0–0.3)
Total Bilirubin: 1 mg/dL (ref 0.3–1.2)
Total Protein: 7.3 g/dL (ref 6.0–8.3)

## 2011-04-18 LAB — POCT URINALYSIS DIPSTICK
Bilirubin, UA: NEGATIVE
Blood, UA: NEGATIVE
Glucose, UA: NEGATIVE
Leukocytes, UA: NEGATIVE
Nitrite, UA: NEGATIVE
Urobilinogen, UA: 0.2

## 2011-04-18 LAB — LIPID PANEL
LDL Cholesterol: 49 mg/dL (ref 0–99)
Total CHOL/HDL Ratio: 2

## 2011-04-18 LAB — BASIC METABOLIC PANEL
BUN: 17 mg/dL (ref 6–23)
Chloride: 105 mEq/L (ref 96–112)
Creatinine, Ser: 1 mg/dL (ref 0.4–1.5)
GFR: 81.87 mL/min (ref >60.00)
Potassium: 4 mEq/L (ref 3.5–5.1)

## 2011-04-18 LAB — CBC WITH DIFFERENTIAL/PLATELET
Basophils Relative: 0.5 % (ref 0.0–3.0)
Eosinophils Relative: 3.5 % (ref 0.0–5.0)
MCV: 93.9 fl (ref 78.0–100.0)
Monocytes Relative: 7 % (ref 3.0–12.0)
Neutrophils Relative %: 60.2 % (ref 43.0–77.0)
Platelets: 179 10*3/uL (ref 150.0–400.0)
RBC: 4.97 Mil/uL (ref 4.22–5.81)
WBC: 5 10*3/uL (ref 4.5–10.5)

## 2011-04-18 LAB — PSA: PSA: 0.76 ng/mL (ref 0.10–4.00)

## 2011-04-18 LAB — TESTOSTERONE: Testosterone: 398.22 ng/dL (ref 350.00–890.00)

## 2011-04-21 LAB — CK TOTAL AND CKMB (NOT AT ARMC)
CK, MB: 2.3
Total CK: 172

## 2011-04-21 LAB — BASIC METABOLIC PANEL
CO2: 27
Calcium: 9
Creatinine, Ser: 0.92
GFR calc Af Amer: >60
Glucose, Bld: 100 — ABNORMAL HIGH

## 2011-04-21 LAB — DIFFERENTIAL
Basophils Absolute: 0
Basophils Relative: 1
Eosinophils Absolute: 0
Eosinophils Relative: 1
Monocytes Absolute: 0.4

## 2011-04-21 LAB — POCT I-STAT CREATININE: Creatinine, Ser: 1

## 2011-04-21 LAB — I-STAT 8, (EC8 V) (CONVERTED LAB)
Acid-Base Excess: 3 — ABNORMAL HIGH
Chloride: 105
HCT: 47
Hemoglobin: 16
Operator id: 294501
Sodium: 139
pCO2, Ven: 47.9

## 2011-04-21 LAB — CARDIAC PANEL(CRET KIN+CKTOT+MB+TROPI)
Total CK: 146
Total CK: 157
Troponin I: 0.01

## 2011-04-21 LAB — CBC
MCHC: 34.4
RDW: 13.3

## 2011-04-21 LAB — LIPID PANEL
Cholesterol: 99
Total CHOL/HDL Ratio: 2
VLDL: 9

## 2011-04-21 LAB — POCT CARDIAC MARKERS: CKMB, poc: 1.5

## 2011-04-28 ENCOUNTER — Encounter: Payer: Self-pay | Admitting: Internal Medicine

## 2011-04-29 ENCOUNTER — Encounter: Payer: Self-pay | Admitting: Internal Medicine

## 2011-04-29 ENCOUNTER — Telehealth: Payer: Self-pay | Admitting: *Deleted

## 2011-04-29 ENCOUNTER — Ambulatory Visit (INDEPENDENT_AMBULATORY_CARE_PROVIDER_SITE_OTHER): Payer: PRIVATE HEALTH INSURANCE | Admitting: Internal Medicine

## 2011-04-29 VITALS — BP 168/90 | HR 76 | Temp 98.4°F | Ht 67.0 in | Wt 184.0 lb

## 2011-04-29 DIAGNOSIS — Z23 Encounter for immunization: Secondary | ICD-10-CM

## 2011-04-29 DIAGNOSIS — Z0000 Encounter for general adult medical examination without abnormal findings: Secondary | ICD-10-CM

## 2011-04-29 NOTE — Progress Notes (Signed)
  Subjective:    Patient ID: Douglas Delgado, male    DOB: 08-16-1955, 55 y.o.   MRN: [id number]  HPI  cpx Note weight loss  Home BPs---120/68 average.  Past Medical History  Diagnosis Date  . Hypertension    No past surgical history on file.  reports that he has never smoked. He does not have any smokeless tobacco history on file. He reports that he drinks alcohol. His drug history not on file. family history includes Diabetes in his father and mother; Heart disease in his father; Hyperlipidemia in his father; Hypertension in his mother; and Mental illness in his father. Allergies  Allergen Reactions  . Penicillins     REACTION: reaction when 55 years old     Review of Systems  patient denies chest pain, shortness of breath, orthopnea. Denies lower extremity edema, abdominal pain, change in appetite, change in bowel movements. Patient denies rashes, musculoskeletal complaints. No other specific complaints in a complete review of systems.      Objective:   Physical Exam Well-developed male in no acute distress. HEENT exam atraumatic, normocephalic, extraocular muscles are intact. Conjunctivae are pink without exudate. Neck is supple without lymphadenopathy, thyromegaly, jugular venous distention. Chest is clear to auscultation without increased work of breathing. Cardiac exam S1-S2 are regular. The PMI is normal. No significant murmurs or gallops. Abdominal exam active bowel sounds, soft, nontender. No abdominal bruits. Extremities no clubbing cyanosis or edema. Peripheral pulses are normal without bruits. Neurologic exam alert and oriented without any motor or sensory deficits. Rectal exam normal tone prostate normal size without masses or asymmetry.        Assessment & Plan:  Well visit: Health maintenance up-to-date.

## 2011-04-29 NOTE — Telephone Encounter (Signed)
Patient states that he was in for CPE this morning and had some changes made in his medications; forgot to ask if he should stop taking daily ASA 81mg  or not.?

## 2011-04-29 NOTE — Patient Instructions (Signed)
Stop HCTZ and monitor BP.  If Bp increases and is consistently above 135/85 resume HCTZ.  If BP stays "low" (125/80 or less) call my office and we will adjust ramipril

## 2011-04-30 NOTE — Telephone Encounter (Signed)
Stay on aspirin

## 2011-04-30 NOTE — Telephone Encounter (Signed)
Pt aware.

## 2011-08-18 ENCOUNTER — Other Ambulatory Visit: Payer: Self-pay | Admitting: Internal Medicine

## 2011-09-05 ENCOUNTER — Telehealth: Payer: Self-pay | Admitting: *Deleted

## 2011-09-05 NOTE — Telephone Encounter (Signed)
Stay on same meds

## 2011-09-05 NOTE — Telephone Encounter (Signed)
FYI for Dr. Cato Mulligan from Pt.  He has maintained his weight loss, and average BP is 130/74.  He is taking Ramipril 10 mg po daily and low dose ASA.  Asking if Dr. Cato Mulligan needs to make any changes.?

## 2011-09-05 NOTE — Telephone Encounter (Signed)
Pt. Notified.

## 2012-04-23 ENCOUNTER — Ambulatory Visit (INDEPENDENT_AMBULATORY_CARE_PROVIDER_SITE_OTHER): Payer: PRIVATE HEALTH INSURANCE

## 2012-04-23 DIAGNOSIS — Z23 Encounter for immunization: Secondary | ICD-10-CM

## 2012-06-22 ENCOUNTER — Other Ambulatory Visit (INDEPENDENT_AMBULATORY_CARE_PROVIDER_SITE_OTHER): Payer: PRIVATE HEALTH INSURANCE

## 2012-06-22 DIAGNOSIS — Z0000 Encounter for general adult medical examination without abnormal findings: Secondary | ICD-10-CM

## 2012-06-22 LAB — CBC WITH DIFFERENTIAL/PLATELET
Basophils Relative: 0.9 % (ref 0.0–3.0)
Eosinophils Relative: 4.9 % (ref 0.0–5.0)
Hemoglobin: 15.1 g/dL (ref 13.0–17.0)
Lymphocytes Relative: 33.9 % (ref 12.0–46.0)
MCHC: 34.3 g/dL (ref 30.0–36.0)
Monocytes Relative: 7.1 % (ref 3.0–12.0)
Neutro Abs: 2.9 10*3/uL (ref 1.4–7.7)
RBC: 4.76 Mil/uL (ref 4.22–5.81)
WBC: 5.4 10*3/uL (ref 4.5–10.5)

## 2012-06-22 LAB — POCT URINALYSIS DIPSTICK
Blood, UA: NEGATIVE
Glucose, UA: NEGATIVE
Nitrite, UA: NEGATIVE
Protein, UA: NEGATIVE
Urobilinogen, UA: 0.2

## 2012-06-22 LAB — HEPATIC FUNCTION PANEL
AST: 19 U/L (ref 0–37)
Albumin: 4.3 g/dL (ref 3.5–5.2)
Total Protein: 7.1 g/dL (ref 6.0–8.3)

## 2012-06-22 LAB — BASIC METABOLIC PANEL
BUN: 18 mg/dL (ref 6–23)
CO2: 28 mEq/L (ref 19–32)
Chloride: 104 mEq/L (ref 96–112)
Creatinine, Ser: 1 mg/dL (ref 0.4–1.5)
Glucose, Bld: 107 mg/dL — ABNORMAL HIGH (ref 70–99)

## 2012-06-22 LAB — LIPID PANEL
LDL Cholesterol: 45 mg/dL (ref 0–99)
Total CHOL/HDL Ratio: 2
VLDL: 8.8 mg/dL (ref 0.0–40.0)

## 2012-06-22 LAB — TSH: TSH: 1.26 u[IU]/mL (ref 0.35–5.50)

## 2012-06-22 LAB — PSA: PSA: 0.87 ng/mL (ref 0.10–4.00)

## 2012-06-28 ENCOUNTER — Encounter: Payer: Self-pay | Admitting: Internal Medicine

## 2012-06-28 ENCOUNTER — Ambulatory Visit (INDEPENDENT_AMBULATORY_CARE_PROVIDER_SITE_OTHER): Payer: PRIVATE HEALTH INSURANCE | Admitting: Internal Medicine

## 2012-06-28 VITALS — BP 184/98 | HR 88 | Temp 98.1°F | Ht 67.5 in | Wt 193.0 lb

## 2012-06-28 DIAGNOSIS — Z0000 Encounter for general adult medical examination without abnormal findings: Secondary | ICD-10-CM

## 2012-06-28 DIAGNOSIS — Z2911 Encounter for prophylactic immunotherapy for respiratory syncytial virus (RSV): Secondary | ICD-10-CM

## 2012-06-28 MED ORDER — RAMIPRIL 10 MG PO CAPS: 10.0000 mg | ORAL_CAPSULE | Freq: Every day | ORAL | Status: DC

## 2012-06-28 NOTE — Progress Notes (Signed)
Patient ID: Douglas Delgado, male   DOB: Nov 21, 1955, 56 y.o.   MRN: [id number] cpx  Home bps 115-125/70s  Past Medical History  Diagnosis Date  . Hypertension     History   Social History  . Marital Status: Married    Spouse Name: N/A    Number of Children: N/A  . 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 Smoking status: Never Smoker   . Smokeless tobacco: Not on file  . Alcohol Use: Yes  . Drug Use:   . Sexually Active:    Other Topics Concern  . Not on file   Social History Narrative   Gets reg exerciseMarried 2nd wife2 kids - healthy    No past surgical history on file.  Family History  Problem Relation Age of Onset  . Diabetes Mother   . Hypertension Mother   . Heart disease Father     cabg x2  . Hyperlipidemia Father   . Diabetes Father   . Mental illness Father     dementia    Allergies  Allergen Reactions  . Penicillins     REACTION: reaction when 56 years old    Current Outpatient Prescriptions on File Prior to Visit  Medication Sig Dispense Refill  . aspirin 81 MG tablet Take 81 mg by mouth daily.        . Multiple Vitamin (MULTIVITAMIN) tablet Take 1 tablet by mouth daily.        . ramipril (ALTACE) 10 MG capsule TAKE ONE CAPSULE EVERY DAY  30 capsule  11     patient denies chest pain, shortness of breath, orthopnea. Denies lower extremity edema, abdominal pain, change in appetite, change in bowel movements. Patient denies rashes, musculoskeletal complaints. No other specific complaints in a complete review of systems.   BP 184/98  Pulse 88  Temp 98.1 F (36.7 C) (Oral)  Ht 5' 7.5" (1.715 m)  Wt 193 lb (87.544 kg)  BMI 29.78 kg/m2  well-developed well-nourished male in no acute distress. HEENT exam atraumatic, normocephalic, neck supple without jugular venous distention. Chest clear to auscultation cardiac exam S1-S2 are regular. Abdominal exam overweight with bowel sounds, soft  and nontender. Extremities no edema. Neurologic exam is alert with a normal gait.   A/P- well visit- health maint uTD

## 2012-08-05 ENCOUNTER — Other Ambulatory Visit: Payer: Self-pay | Admitting: Internal Medicine

## 2012-10-18 ENCOUNTER — Other Ambulatory Visit: Payer: Self-pay | Admitting: *Deleted

## 2012-10-18 MED ORDER — RAMIPRIL 10 MG PO CAPS: 10.0000 mg | ORAL_CAPSULE | Freq: Every day | ORAL | Status: DC

## 2013-04-15 ENCOUNTER — Ambulatory Visit (INDEPENDENT_AMBULATORY_CARE_PROVIDER_SITE_OTHER): Payer: Commercial Managed Care - PPO

## 2013-04-15 DIAGNOSIS — Z23 Encounter for immunization: Secondary | ICD-10-CM

## 2013-07-11 ENCOUNTER — Other Ambulatory Visit: Payer: Self-pay | Admitting: Internal Medicine

## 2013-07-22 ENCOUNTER — Other Ambulatory Visit (INDEPENDENT_AMBULATORY_CARE_PROVIDER_SITE_OTHER): Payer: Commercial Managed Care - PPO

## 2013-07-22 DIAGNOSIS — Z0000 Encounter for general adult medical examination without abnormal findings: Secondary | ICD-10-CM

## 2013-07-22 LAB — BASIC METABOLIC PANEL
BUN: 16 mg/dL (ref 6–23)
CHLORIDE: 104 meq/L (ref 96–112)
CO2: 28 mEq/L (ref 19–32)
Calcium: 9.5 mg/dL (ref 8.4–10.5)
Creatinine, Ser: 1 mg/dL (ref 0.4–1.5)
GFR: 82.17 mL/min (ref >60.00)
GLUCOSE: 93 mg/dL (ref 70–99)
POTASSIUM: 5 meq/L (ref 3.5–5.1)
Sodium: 139 mEq/L (ref 135–145)

## 2013-07-22 LAB — LIPID PANEL
CHOL/HDL RATIO: 2
CHOLESTEROL: 111 mg/dL (ref 0–200)
HDL: 57.8 mg/dL (ref >39.00)
LDL Cholesterol: 45 mg/dL (ref 0–99)
TRIGLYCERIDES: 42 mg/dL (ref 0.0–149.0)
VLDL: 8.4 mg/dL (ref 0.0–40.0)

## 2013-07-22 LAB — CBC WITH DIFFERENTIAL/PLATELET
BASOS ABS: 0 10*3/uL (ref 0.0–0.1)
Basophils Relative: 0.7 % (ref 0.0–3.0)
Eosinophils Absolute: 0.3 10*3/uL (ref 0.0–0.7)
Eosinophils Relative: 4.7 % (ref 0.0–5.0)
HEMATOCRIT: 45.1 % (ref 39.0–52.0)
Hemoglobin: 15.5 g/dL (ref 13.0–17.0)
LYMPHS ABS: 1.8 10*3/uL (ref 0.7–4.0)
Lymphocytes Relative: 30 % (ref 12.0–46.0)
MCHC: 34.4 g/dL (ref 30.0–36.0)
MCV: 92.1 fl (ref 78.0–100.0)
MONO ABS: 0.4 10*3/uL (ref 0.1–1.0)
Monocytes Relative: 6.1 % (ref 3.0–12.0)
Neutro Abs: 3.5 10*3/uL (ref 1.4–7.7)
Neutrophils Relative %: 58.5 % (ref 43.0–77.0)
PLATELETS: 178 10*3/uL (ref 150.0–400.0)
RBC: 4.89 Mil/uL (ref 4.22–5.81)
RDW: 13.2 % (ref 11.5–14.6)
WBC: 6 10*3/uL (ref 4.5–10.5)

## 2013-07-22 LAB — POCT URINALYSIS DIPSTICK
Bilirubin, UA: NEGATIVE
Blood, UA: NEGATIVE
GLUCOSE UA: NEGATIVE
Ketones, UA: NEGATIVE
Leukocytes, UA: NEGATIVE
NITRITE UA: NEGATIVE
Protein, UA: NEGATIVE
Spec Grav, UA: 1.015
UROBILINOGEN UA: 0.2
pH, UA: 7

## 2013-07-22 LAB — HEPATIC FUNCTION PANEL
ALT: 22 U/L (ref 0–53)
AST: 20 U/L (ref 0–37)
Albumin: 4.2 g/dL (ref 3.5–5.2)
Alkaline Phosphatase: 59 U/L (ref 39–117)
BILIRUBIN TOTAL: 0.9 mg/dL (ref 0.3–1.2)
Bilirubin, Direct: 0.2 mg/dL (ref 0.0–0.3)
Total Protein: 6.9 g/dL (ref 6.0–8.3)

## 2013-07-22 LAB — TSH: TSH: 1.4 u[IU]/mL (ref 0.35–5.50)

## 2013-07-22 LAB — PSA: PSA: 0.51 ng/mL (ref 0.10–4.00)

## 2013-07-29 ENCOUNTER — Encounter: Payer: Self-pay | Admitting: Internal Medicine

## 2013-07-29 ENCOUNTER — Ambulatory Visit (INDEPENDENT_AMBULATORY_CARE_PROVIDER_SITE_OTHER): Payer: Commercial Managed Care - PPO | Admitting: Internal Medicine

## 2013-07-29 VITALS — BP 160/90 | HR 80 | Temp 98.1°F | Ht 66.5 in | Wt 188.0 lb

## 2013-07-29 DIAGNOSIS — Z0000 Encounter for general adult medical examination without abnormal findings: Secondary | ICD-10-CM

## 2013-07-29 NOTE — Progress Notes (Signed)
Pre visit review using our clinic review tool, if applicable. No additional management support is needed unless otherwise documented below in the visit note. 

## 2013-07-29 NOTE — Progress Notes (Signed)
  CPX HOME BPs= 120s/73-140/80  Past Medical History  Diagnosis Date  . Hypertension     History   Social History  . Marital Status: Married    Spouse Name: N/A    Number of Children: N/A  . 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 Smoking status: Never Smoker   . Smokeless tobacco: Not on file  . Alcohol Use: Yes  . Drug Use:   . Sexual Activity:    Other Topics Concern  . Not on file   Social History Narrative   Gets reg exercise   Married 2nd wife   2 kids - healthy    No past surgical history on file.  Family History  Problem Relation Age of Onset  . Diabetes Mother   . Hypertension Mother   . Heart disease Father     cabg x2  . Hyperlipidemia Father   . Diabetes Father   . Mental illness Father     dementia    Allergies  Allergen Reactions  . Penicillins     REACTION: reaction when 58 years old    Current Outpatient Prescriptions on File Prior to Visit  Medication Sig Dispense Refill  . aspirin 81 MG tablet Take 81 mg by mouth daily.        . Multiple Vitamin (MULTIVITAMIN) tablet Take 1 tablet by mouth daily.        . ramipril (ALTACE) 10 MG capsule Take 1 capsule by mouth  daily.  90 capsule  0   No current facility-administered medications on file prior to visit.     patient denies chest pain, shortness of breath, orthopnea. Denies lower extremity edema, abdominal pain, change in appetite, change in bowel movements. Patient denies rashes, musculoskeletal complaints. No other specific complaints in a complete review of systems.   BP 210/120  Pulse 80  Temp(Src) 98.1 F (36.7 C) (Oral)  Ht 5' 6.5" (1.689 m)  Wt 188 lb (85.276 kg)  BMI 29.89 kg/m2 Well-developed male in no acute distress. HEENT exam atraumatic, normocephalic, extraocular muscles are intact. Conjunctivae are pink without exudate. Neck is supple without lymphadenopathy, thyromegaly, jugular venous distention.  Chest is clear to auscultation without increased work of breathing. Cardiac exam S1-S2 are regular. The PMI is normal. No significant murmurs or gallops. Abdominal exam active bowel sounds, soft, nontender. No abdominal bruits. Extremities no clubbing cyanosis or edema. Peripheral pulses are normal without bruits. Neurologic exam alert and oriented without any motor or sensory deficits. Rectal exam normal tone prostate normal size without masses or asymmetry.  Well Visit- health maint UTD

## 2013-08-11 ENCOUNTER — Ambulatory Visit (INDEPENDENT_AMBULATORY_CARE_PROVIDER_SITE_OTHER): Payer: Commercial Managed Care - PPO | Admitting: Family Medicine

## 2013-08-11 ENCOUNTER — Encounter: Payer: Self-pay | Admitting: Family Medicine

## 2013-08-11 ENCOUNTER — Other Ambulatory Visit: Payer: Self-pay | Admitting: Internal Medicine

## 2013-08-11 VITALS — BP 162/90 | Temp 99.0°F | Wt 190.0 lb

## 2013-08-11 DIAGNOSIS — H109 Unspecified conjunctivitis: Secondary | ICD-10-CM

## 2013-08-11 MED ORDER — SULFACETAMIDE SODIUM 10 % OP SOLN: 1.0000 [drp] | OPHTHALMIC | Status: DC

## 2013-08-11 NOTE — Progress Notes (Signed)
Chief Complaint  Patient presents with  . Conjunctivitis    eyes red and watery, sinus headache, congestion; granddaughter had pink eye     HPI:   ? Pink eye: -started: 2 days ago -symptoms: irritated watery eyes, nasal congestion, mild cough, sore throat, had some sinus pressure/pain initially now resolved -sick contacts: daughter with pink eye -denies: fever, SOB, NVD, body aches, pus from eyes, vision changes, eye pain  ROS: See pertinent positives and negatives per HPI.  Past Medical History  Diagnosis Date  . Hypertension     No past surgical history on file.  Family History  Problem Relation Age of Onset  . Diabetes Mother   . Hypertension Mother   . Heart disease Father     cabg x2  . Hyperlipidemia Father   . Diabetes Father   . Mental illness Father     dementia    History   Social History  . Marital Status: Married    Spouse Name: N/A    Number of Children: N/A  . 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 Smoking status: Never Smoker   . Smokeless tobacco: None  . Alcohol Use: Yes  . Drug Use:   . Sexual Activity:    Other Topics Concern  . None   Social History Narrative   Gets reg exercise   Married 2nd wife   2 kids - healthy    Current outpatient prescriptions:aspirin 81 MG tablet, Take 81 mg by mouth daily.  , Disp: , Rfl: ;  Multiple Vitamin (MULTIVITAMIN) tablet, Take 1 tablet by mouth daily.  , Disp: , Rfl: ;  ramipril (ALTACE) 10 MG capsule, Take 1 capsule by mouth  daily., Disp: 90 capsule, Rfl: 0;  sulfacetamide (BLEPH-10) 10 % ophthalmic solution, Place 1 drop into both eyes every 3 (three) hours., Disp: 15 mL, Rfl: 0  EXAM:  Filed Vitals:   08/11/13 0903  BP: 162/90  Temp: 99 F (37.2 C)    Body mass index is 30.21 kg/(m^2).  GENERAL: vitals reviewed and listed above, alert, oriented, appears well hydrated and in no acute distress  HEENT: atraumatic,  conjunttiva mildly erythematous with watery drainage from eyes, visual acuity grossly intact, PERRLA, no obvious abnormalities on inspection of external nose and ears, normal appearance of ear canals and TMs, clear nasal congestion, mild post oropharyngeal erythema with PND, no tonsillar edema or exudate, no sinus TTP  NECK: no obvious masses on inspection  LUNGS: clear to auscultation bilaterally, no wheezes, rales or rhonchi, good air movement  CV: HRRR, no peripheral edema  MS: moves all extremities without noticeable abnormality  PSYCH: pleasant and cooperative, no obvious depression or anxiety  ASSESSMENT AND PLAN:  Discussed the following assessment and plan:  Conjunctivitis - Plan: sulfacetamide (BLEPH-10) 10 % ophthalmic solution  -we discussed possible serious and likely etiologies, workup and treatment, treatment risks and return precautions - likely viral URI -after this discussion, Loraine LericheMark opted for compresses, artificial tears, abx drops if eye symptoms persist and eye doctor if worsens -of course, we advised Loraine LericheMark  to return or notify a doctor immediately if symptoms worsen or persist or new concerns arise.  .  -Patient advised to return or notify a doctor immediately if symptoms worsen or persist or new concerns arise.  There are no Patient Instructions on file for this visit.   Kriste BasqueKIM, Marlene Beidler R.

## 2013-08-11 NOTE — Progress Notes (Signed)
Pre visit review using our clinic review tool, if applicable. No additional management support is needed unless otherwise documented below in the visit note. 

## 2013-08-11 NOTE — Patient Instructions (Signed)

## 2014-02-13 ENCOUNTER — Telehealth: Payer: Self-pay | Admitting: Internal Medicine

## 2014-02-13 NOTE — Telephone Encounter (Signed)
Pt would like to est with dr Caryl Neverburchette. Can I sch?

## 2014-02-14 NOTE — Telephone Encounter (Signed)
Pt is aware and will think about if he want to go with another provider here

## 2014-02-14 NOTE — Telephone Encounter (Signed)
Let's encourage these folks to try one of our providers who do not have full panels. So sorry that I cant take on any more.

## 2014-03-02 ENCOUNTER — Telehealth: Payer: Self-pay | Admitting: Internal Medicine

## 2014-03-02 ENCOUNTER — Ambulatory Visit (INDEPENDENT_AMBULATORY_CARE_PROVIDER_SITE_OTHER): Payer: Commercial Managed Care - PPO | Admitting: Family Medicine

## 2014-03-02 ENCOUNTER — Encounter: Payer: Self-pay | Admitting: Family Medicine

## 2014-03-02 VITALS — BP 146/84 | HR 88 | Temp 98.3°F | Ht 66.5 in | Wt 194.0 lb

## 2014-03-02 DIAGNOSIS — L42 Pityriasis rosea: Secondary | ICD-10-CM

## 2014-03-02 DIAGNOSIS — I10 Essential (primary) hypertension: Secondary | ICD-10-CM

## 2014-03-02 MED ORDER — TRIAMCINOLONE ACETONIDE 0.1 % EX CREA: 1.0000 "application " | TOPICAL_CREAM | Freq: Two times a day (BID) | CUTANEOUS | Status: DC

## 2014-03-02 NOTE — Progress Notes (Signed)
Pre visit review using our clinic review tool, if applicable. No additional management support is needed unless otherwise documented below in the visit note. 

## 2014-03-02 NOTE — Patient Instructions (Signed)
-  check your blood pressure randomly about 3-5 times per week -call if running over 160/90s -bring your cuff and blood pressure log to the appointment in about 1 month  Pityriasis Rosea Pityriasis rosea is a rash which is probably caused by a virus. It generally starts as a scaly, red patch on the trunk (the area of the body that a t-shirt would cover) but does not appear on sun exposed areas. The rash is usually preceded by an initial larger spot called the "herald patch" a week or more before the rest of the rash appears. Generally within one to two days the rash appears rapidly on the trunk, upper arms, and sometimes the upper legs. The rash usually appears as flat, oval patches of scaly pink color. The rash can also be raised and one is able to feel it with a finger. The rash can also be finely crinkled and may slough off leaving a ring of scale around the spot. Sometimes a mild sore throat is present with the rash. It usually affects children and young adults in the spring and autumn. Women are more frequently affected than men. TREATMENT  Pityriasis rosea is a self-limited condition. This means it goes away within 4 to 8 weeks without treatment. The spots may persist for several months, especially in darker-colored skin after the rash has resolved and healed. Benadryl and steroid creams may be used if itching is a problem. SEEK MEDICAL CARE IF:   Your rash does not go away or persists longer than three months.  You develop fever and joint pain.  You develop severe headache and confusion.  You develop breathing difficulty, vomiting and/or extreme weakness. Document Released: 07/30/2001 Document Revised: 09/15/2011 Document Reviewed: 08/18/2008 [facility] Patient Information 2015 [place], [place]. This information is not intended to replace advice given to you by your health care provider. Make sure you discuss any questions you have with your health care provider.

## 2014-03-02 NOTE — Telephone Encounter (Signed)
I resent the Rx to [facility].

## 2014-03-02 NOTE — Telephone Encounter (Signed)
Pt rx sent to the wrong pharmacy. It should have been sent to [facility]     triamcinolone cream (KENALOG) 0.1 %

## 2014-03-02 NOTE — Progress Notes (Signed)
No chief complaint on file.   HPI:  Acute visit for:  1) Rash: -on chest and back -started: a month or 2 ago with one patch on chest that then spread to back and chest onlay - thought it was dry cleaning detergent as had switched drycleaner -symptoms: mildly itchy rash on chest and back bilat -calamine helps, 1% hydrocortisone helps, benadryl helps too - but all short term -denies: fevers, weight loss, malaise, hx of skin rash or eczema other then allergy to poison ivy or oak  2)HTN: -reports always elevated at doctor visits -reports monitors at home and is normal at home and has log showing 120-130s/70s -on ramipril but does not want to take any more medications -denies: CP, SOB, palpitation, chest pain  ROS: See pertinent positives and negatives per HPI.  Past Medical History  Diagnosis Date  . Hypertension     No past surgical history on file.  Family History  Problem Relation Age of Onset  . Diabetes Mother   . Hypertension Mother   . Heart disease Father     cabg x2  . Hyperlipidemia Father   . Diabetes Father   . Mental illness Father     dementia    History   Social History  . Marital Status: Married    Spouse Name: N/A    Number of Children: N/A  . 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 Smoking status: Never Smoker   . Smokeless tobacco: None  . Alcohol Use: Yes  . Drug Use:   . Sexual Activity:    Other Topics Concern  . None   Social History Narrative   Gets reg exercise   Married 2nd wife   2 kids - healthy    Current outpatient prescriptions:aspirin 81 MG tablet, Take 81 mg by mouth daily.  , Disp: , Rfl: ;  Multiple Vitamin (MULTIVITAMIN) tablet, Take 1 tablet by mouth daily.  , Disp: , Rfl: ;  ramipril (ALTACE) 10 MG capsule, Take 1 capsule by mouth  daily, Disp: 90 capsule, Rfl: 3;  triamcinolone cream (KENALOG) 0.1 %, Apply 1 application topically 2 (two) times daily.,  Disp: 45 g, Rfl: 0  EXAM:  Filed Vitals:   03/02/14 1050  BP: 146/84  Pulse: 88  Temp: 98.3 F (36.8 C)   BP much improved on manual recheck after sitting quietly for 5 minutes.  Body mass index is 30.85 kg/(m^2).  GENERAL: vitals reviewed and listed above, alert, oriented, appears well hydrated and in no acute distress  HEENT: atraumatic, conjunttiva clear, no obvious abnormalities on inspection of external nose and ears  NECK: no obvious masses on inspection  LUNGS: clear to auscultation bilaterally, no wheezes, rales or rhonchi, good air movement  CV: HRRR, no peripheral edema  SKIN: scattered oval to irr shaped, 64mm-20mm in diameter erythematous patches of finely scaly skin isolated to the trunk and back   MS: moves all extremities without noticeable abnormality  PSYCH: pleasant and cooperative, no obvious depression or anxiety  ASSESSMENT AND PLAN:  Discussed the following assessment and plan:  Pityriasis rosea - Plan: triamcinolone cream (KENALOG) 0.1 % -we discussed other possible causes of this type rash but PR most likely given symptoms, distribution and appearance -opted to monitor and follow up in 1 months  Essential hypertension, benign -he feels there is a strong white coat component to his BP - his log from home looks  good -he is reluctant to change or add medication -advised monitoring at home with close follow up to ensure his cuff is acurrate  -I initially advised he follow up with me in one month, but he has a new patient visit scheduled with Dr. Durene Cal in a month and wonders if can follow up then -  I will alert Dr. Denita Lung this note to him  -Patient advised to return or notify a doctor immediately if symptoms worsen or persist or new concerns arise.  Patient Instructions  -check your blood pressure randomly about 3-5 times per week -call if running over 160/90s -bring your cuff and blood pressure log to the appointment in about 1  month  Pityriasis Rosea Pityriasis rosea is a rash which is probably caused by a virus. It generally starts as a scaly, red patch on the trunk (the area of the body that a t-shirt would cover) but does not appear on sun exposed areas. The rash is usually preceded by an initial larger spot called the "herald patch" a week or more before the rest of the rash appears. Generally within one to two days the rash appears rapidly on the trunk, upper arms, and sometimes the upper legs. The rash usually appears as flat, oval patches of scaly pink color. The rash can also be raised and one is able to feel it with a finger. The rash can also be finely crinkled and may slough off leaving a ring of scale around the spot. Sometimes a mild sore throat is present with the rash. It usually affects children and young adults in the spring and autumn. Women are more frequently affected than men. TREATMENT  Pityriasis rosea is a self-limited condition. This means it goes away within 4 to 8 weeks without treatment. The spots may persist for several months, especially in darker-colored skin after the rash has resolved and healed. Benadryl and steroid creams may be used if itching is a problem. SEEK MEDICAL CARE IF:   Your rash does not go away or persists longer than three months.  You develop fever and joint pain.  You develop severe headache and confusion.  You develop breathing difficulty, vomiting and/or extreme weakness. Document Released: 07/30/2001 Document Revised: 09/15/2011 Document Reviewed: 08/18/2008 [facility] Patient Information 2015 [place], [place]. This information is not intended to replace advice given to you by your health care provider. Make sure you discuss any questions you have with your health care provider.      Kriste Basque R.

## 2014-03-02 NOTE — Addendum Note (Signed)
Addended by: Johnella Moloney on: 03/02/2014 11:42 AM   Modules accepted: Orders

## 2014-04-20 ENCOUNTER — Encounter: Payer: Self-pay | Admitting: Family Medicine

## 2014-04-20 ENCOUNTER — Ambulatory Visit (INDEPENDENT_AMBULATORY_CARE_PROVIDER_SITE_OTHER): Payer: Commercial Managed Care - PPO | Admitting: Family Medicine

## 2014-04-20 VITALS — BP 150/82 | HR 72 | Temp 98.1°F | Wt 193.0 lb

## 2014-04-20 DIAGNOSIS — I10 Essential (primary) hypertension: Secondary | ICD-10-CM

## 2014-04-20 DIAGNOSIS — N529 Male erectile dysfunction, unspecified: Secondary | ICD-10-CM | POA: Insufficient documentation

## 2014-04-20 DIAGNOSIS — N522 Drug-induced erectile dysfunction: Secondary | ICD-10-CM

## 2014-04-20 DIAGNOSIS — Z23 Encounter for immunization: Secondary | ICD-10-CM

## 2014-04-20 NOTE — Assessment & Plan Note (Signed)
Hopeful to trial off of ramipril to see if this helps with ED. Also has never trialed stendra which could could be an option to see if helps more than viagra with lower SE

## 2014-04-20 NOTE — Patient Instructions (Addendum)
Physical in 3 months  Bring your blood pressure cuff with you. If we verify your home cuff and your blood pressure remains so well controlled at home, I think it would be reasonable to trial off of ramipril and then you would send me home logs through mychart to determine if taking you off is safe.   Flu shot today  MV and ocuvite-really up to you. I think the more important part is continuing your excellent diet and exercise.

## 2014-04-20 NOTE — Progress Notes (Signed)
Douglas ConchStephen Hunter, MD Phone: [phone]  Subjective:  Patient presents today to establish care with me as their new primary care provider. Patient was formerly a patient of Dr. Cato Delgado. Chief complaint-noted.   Hypertension-suspect white coat element.   BP Readings from Last 3 Encounters:  04/20/14 150/82  03/02/14 146/84  08/11/13 162/90  Bring cuff to annual physical in 3 months.  Home BP monitoring-averaging 124-135/71-80 Eats well, avoids fried foods [place] [address] 2-3x a week.  Compliant with medications-yes without side effects, ramipril 10mg  ROS-Denies any CP, HA, SOB, blurry vision, LE edema, transient weakness, orthopnea, PND.   Erectile dysfunction General awful/almost flu like feeling when taking viagra. Also took cialis and felt same way with no good results. This also did not improve with testosterone therapy.   The following were reviewed and entered/updated in epic: Past Medical History  Diagnosis Date  . Hypertension    Patient Active Problem List   Diagnosis Date Noted  . Essential hypertension 05/19/2007    Priority: Medium  . Erectile dysfunction 04/20/2014    Priority: Low  . Testicular hypofunction 03/21/2010    Priority: Low   Past Surgical History  Procedure Laterality Date  . None      Family History  Problem Relation Age of Onset  . Diabetes Mother   . Hypertension Mother   . Heart disease Father     cabg x2, heavy smoker  . Hyperlipidemia Father   . Diabetes Father   . Mental illness Father     dementia    Medications- reviewed and updated Current Outpatient Prescriptions  Medication Sig Dispense Refill  . aspirin 81 MG tablet Take 81 mg by mouth daily.        . Multiple Vitamin (MULTIVITAMIN) tablet Take 1 tablet by mouth daily.        . ramipril (ALTACE) 10 MG capsule Take 1 capsule by mouth  daily  90 capsule  3  . triamcinolone cream (KENALOG) 0.1 % Apply 1 application topically 2 (two) times daily.  45 g  0   No current  facility-administered medications for this visit.    Allergies-reviewed and updated Allergies  Allergen Reactions  . Penicillins     REACTION: reaction when 58 years old    History   Social History  . Marital Status: Married    Spouse Name: N/A    Number of Children: N/A  . 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 Smoking status: Never Smoker   . Smokeless tobacco: None  . Alcohol Use: Yes     Comment: 6 beers a year  . Drug Use: No  . Sexual Activity: None   Other Topics Concern  . None   Social History Narrative   Married 2nd wife. 2 kids - healthy. 2 grandkids and 1 on the way. 1 in [place] and 1 in [facility]   [employer].       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          ROS--See HPI   Objective: BP 150/82  Pulse 72  Temp(Src) 98.1 F (36.7 C)  Wt 193 lb (87.544 kg) Gen: NAD, resting comfortably CV: RRR no murmurs rubs or gallops Lungs: CTAB no crackles, wheeze, rhonchi Abdomen: soft/nontender/nondistended/normal bowel sounds.  Ext: no edema, 2+ DP and PT pulses Skin: warm, dry,  no rash Neuro: grossly normal, moves all extremities, PERRLA  Assessment/Plan:  Essential hypertension Strongly suspect this is white coat. Patient has regular home readings 124-135/71-80. Will verify cuff with home readings at next visit. If continues to be in this range, consider trial off medicine and if can maintain <140/90 then would stay off BP medicine and continue healthy diet/exercise. Patient with low risk factors for cardiac disease with no DM and LDL <50 and nonsmoker. Family history CAD but in heavy smoking father.   Erectile dysfunction Hopeful to trial off of ramipril to see if this helps with ED. Also has never trialed stendra which could could be an option to see if helps more than viagra with lower SE  f/u 3  months for physical including labs

## 2014-04-20 NOTE — Assessment & Plan Note (Signed)
Strongly suspect this is white coat. Patient has regular home readings 124-135/71-80. Will verify cuff with home readings at next visit. If continues to be in this range, consider trial off medicine and if can maintain <140/90 then would stay off BP medicine and continue healthy diet/exercise. Patient with low risk factors for cardiac disease with no DM and LDL <50 and nonsmoker. Family history CAD but in heavy smoking father.

## 2014-07-10 ENCOUNTER — Other Ambulatory Visit: Payer: Commercial Managed Care - PPO

## 2014-07-17 ENCOUNTER — Other Ambulatory Visit (INDEPENDENT_AMBULATORY_CARE_PROVIDER_SITE_OTHER): Payer: Commercial Managed Care - PPO

## 2014-07-17 ENCOUNTER — Ambulatory Visit: Payer: Commercial Managed Care - PPO | Admitting: Family

## 2014-07-17 DIAGNOSIS — Z0000 Encounter for general adult medical examination without abnormal findings: Secondary | ICD-10-CM

## 2014-07-17 LAB — CBC WITH DIFFERENTIAL/PLATELET
BASOS ABS: 0 10*3/uL (ref 0.0–0.1)
Basophils Relative: 0.8 % (ref 0.0–3.0)
Eosinophils Absolute: 0.4 10*3/uL (ref 0.0–0.7)
Eosinophils Relative: 5.8 % — ABNORMAL HIGH (ref 0.0–5.0)
HCT: 46 % (ref 39.0–52.0)
Hemoglobin: 15.7 g/dL (ref 13.0–17.0)
LYMPHS ABS: 1.9 10*3/uL (ref 0.7–4.0)
LYMPHS PCT: 31.5 % (ref 12.0–46.0)
MCHC: 34.1 g/dL (ref 30.0–36.0)
MCV: 91.3 fl (ref 78.0–100.0)
Monocytes Absolute: 0.4 10*3/uL (ref 0.1–1.0)
Monocytes Relative: 6.4 % (ref 3.0–12.0)
NEUTROS ABS: 3.4 10*3/uL (ref 1.4–7.7)
NEUTROS PCT: 55.5 % (ref 43.0–77.0)
PLATELETS: 188 10*3/uL (ref 150.0–400.0)
RBC: 5.04 Mil/uL (ref 4.22–5.81)
RDW: 13.1 % (ref 11.5–15.5)
WBC: 6.1 10*3/uL (ref 4.0–10.5)

## 2014-07-17 LAB — LIPID PANEL
Cholesterol: 108 mg/dL (ref 0–200)
HDL: 47.8 mg/dL (ref >39.00)
LDL CALC: 48 mg/dL (ref 0–99)
NONHDL: 60.2
TRIGLYCERIDES: 61 mg/dL (ref 0.0–149.0)
Total CHOL/HDL Ratio: 2
VLDL: 12.2 mg/dL (ref 0.0–40.0)

## 2014-07-17 LAB — BASIC METABOLIC PANEL
BUN: 15 mg/dL (ref 6–23)
CO2: 26 mEq/L (ref 19–32)
CREATININE: 1 mg/dL (ref 0.4–1.5)
Calcium: 9.6 mg/dL (ref 8.4–10.5)
Chloride: 105 mEq/L (ref 96–112)
GFR: 77.37 mL/min (ref >60.00)
Glucose, Bld: 103 mg/dL — ABNORMAL HIGH (ref 70–99)
Potassium: 5.1 mEq/L (ref 3.5–5.1)
Sodium: 140 mEq/L (ref 135–145)

## 2014-07-17 LAB — POCT URINALYSIS DIPSTICK
Bilirubin, UA: NEGATIVE
Blood, UA: NEGATIVE
Glucose, UA: NEGATIVE
Ketones, UA: NEGATIVE
Leukocytes, UA: NEGATIVE
Nitrite, UA: NEGATIVE
PH UA: 6
PROTEIN UA: NEGATIVE
UROBILINOGEN UA: 0.2

## 2014-07-17 LAB — HEPATIC FUNCTION PANEL
ALK PHOS: 62 U/L (ref 39–117)
ALT: 30 U/L (ref 0–53)
AST: 21 U/L (ref 0–37)
Albumin: 4.3 g/dL (ref 3.5–5.2)
BILIRUBIN DIRECT: 0.2 mg/dL (ref 0.0–0.3)
BILIRUBIN TOTAL: 0.6 mg/dL (ref 0.2–1.2)
Total Protein: 7.3 g/dL (ref 6.0–8.3)

## 2014-07-17 LAB — TSH: TSH: 1.1 u[IU]/mL (ref 0.35–4.50)

## 2014-07-17 LAB — PSA: PSA: 0.44 ng/mL (ref 0.10–4.00)

## 2014-07-24 ENCOUNTER — Other Ambulatory Visit: Payer: Self-pay | Admitting: Internal Medicine

## 2014-07-24 ENCOUNTER — Encounter: Payer: Self-pay | Admitting: Family Medicine

## 2014-07-24 ENCOUNTER — Ambulatory Visit (INDEPENDENT_AMBULATORY_CARE_PROVIDER_SITE_OTHER): Payer: Commercial Managed Care - PPO | Admitting: Family Medicine

## 2014-07-24 VITALS — BP 158/88 | Temp 98.6°F | Ht 66.0 in | Wt 196.0 lb

## 2014-07-24 DIAGNOSIS — N522 Drug-induced erectile dysfunction: Secondary | ICD-10-CM

## 2014-07-24 DIAGNOSIS — IMO0001 Reserved for inherently not codable concepts without codable children: Secondary | ICD-10-CM

## 2014-07-24 DIAGNOSIS — L42 Pityriasis rosea: Secondary | ICD-10-CM

## 2014-07-24 DIAGNOSIS — I10 Essential (primary) hypertension: Secondary | ICD-10-CM

## 2014-07-24 DIAGNOSIS — R21 Rash and other nonspecific skin eruption: Secondary | ICD-10-CM | POA: Insufficient documentation

## 2014-07-24 DIAGNOSIS — Z0000 Encounter for general adult medical examination without abnormal findings: Secondary | ICD-10-CM

## 2014-07-24 MED ORDER — RAMIPRIL 10 MG PO CAPS: ORAL_CAPSULE | ORAL | Status: DC

## 2014-07-24 MED ORDER — TRIAMCINOLONE ACETONIDE 0.1 % EX CREA: 1.0000 "application " | TOPICAL_CREAM | Freq: Two times a day (BID) | CUTANEOUS | Status: DC

## 2014-07-24 MED ORDER — AVANAFIL 100 MG PO TABS: 50.0000 mg | ORAL_TABLET | Freq: Every day | ORAL | Status: DC | PRN

## 2014-07-24 NOTE — Patient Instructions (Signed)
Blood pressure looks great on home readings. Your cuff ran about 20 points higher on the top number in office which means normal readings at home are likely even better controlled. Continue ramipril. If home readings get above 140/90 please come see us otherwise yearly follow up.   For rash, this could still be the pityriasis rosea Dr. Selena BattenKim discussed with you but odd it recurred. Let's try triamcinolone one more time for 2 weeks and emmollient over top of it (options are vaseline, eucerin, aquaphor). I would send you to dermatology if it recurs.   Try stendra 100mg -can try 1/2 tab 30 minutes before sex to start.Hopeful lower side effect profile and more effective.

## 2014-07-24 NOTE — Assessment & Plan Note (Addendum)
Trial stendra. May need to check testosterone again with history 2011 being low.

## 2014-07-24 NOTE — Assessment & Plan Note (Signed)
Thought to be pityriasis rosea last visit with Dr. Selena BattenKim in 02/2014. Responded to steroids but later recurred. Trial steroids again for his itching and add topical emmollient as xerosis could be contributing. Discussed if this does not clear area (typically should have resolved within 3 months) then would refer to dermatology

## 2014-07-24 NOTE — Progress Notes (Signed)
Tana ConchStephen Correna Meacham, MD Phone: [phone]  Subjective:  Patient presents today for their annual physical. Exercise and healthy eating suffered over the holidays. Chief complaint-noted.   Hypertension-poor control systolic only in office, home cuff verified, good control at home  BP Readings from Last 3 Encounters:  07/24/14 158/88  04/20/14 150/82  03/02/14 146/84  Home BP monitoring-averaging low 130s and mid 70s.  Home cuff reading today- 175/110.  Compliant with medications-yes without side effects except potential ED ROS-Denies any CP, HA, SOB, blurry vision, LE edema  Erectile dysfunction. Poor control cialis and viagra made him feel poorly without helping erections. Symptoms of feeling bad wore off within a few hours. Willing to trial stendra  Rash previously thought to be pityriasis rosea -tingly feeling waist up primarily shoulders and trunk. Took triamcinolone cream and resolved within 2-3 days. Occasionally looks red. Has tried different detergents, soaps with no relief. History dry skin in the winter and not currently taking anything  ROS-not ill appearing, no fever/chills. No new medications. Not immunocompromised. No mucus membrane involvement.   ROS- full  review of systems was completed and negative except for as noted above in HPI.   The following were reviewed and entered/updated in epic: Past Medical History  Diagnosis Date  . Hypertension    Patient Active Problem List   Diagnosis Date Noted  . Essential hypertension 05/19/2007    Priority: Medium  . Erectile dysfunction 04/20/2014    Priority: Low  . Testicular hypofunction 03/21/2010    Priority: Low  . Rash and nonspecific skin eruption 07/24/2014   Past Surgical History  Procedure Laterality Date  . None      Family History  Problem Relation Age of Onset  . Diabetes Mother   . Hypertension Mother   . Heart disease Father     cabg x2, heavy smoker  . Hyperlipidemia Father   . Diabetes Father     . Mental illness Father     dementia    Medications- reviewed and updated Current Outpatient Prescriptions  Medication Sig Dispense Refill  . aspirin 81 MG tablet Take 81 mg by mouth daily.      . Multiple Vitamin (MULTIVITAMIN) tablet Take 1 tablet by mouth daily.      . ramipril (ALTACE) 10 MG capsule Take 1 capsule by mouth  daily 90 capsule 0  . triamcinolone cream (KENALOG) 0.1 % Apply 1 application topically 2 (two) times daily. (Patient not taking: Reported on 07/24/2014) 45 g 0   Allergies-reviewed and updated Allergies  Allergen Reactions  . Penicillins     REACTION: reaction when 59 years old    History   Social History  . Marital Status: Married    Spouse Name: N/A    Number of Children: N/A  . 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 Smoking status: Never Smoker   . Smokeless tobacco: None  . Alcohol Use: Yes     Comment: 6 beers a year  . Drug Use: No  . Sexual Activity: None   Other Topics Concern  . None   Social History Narrative   Married 2nd wife. 2 kids - healthy. 3 grandkids, last born December 2015. [place]   [employer] [place].       [employer]O [employer]      Hobbies: ride bike on [address], sports-[employer], [place] , grandkids, reading.  ROS--See HPI   Objective: BP 158/88 mmHg  Temp(Src) 98.6 F (37 C)  Ht  (1.676 m)  Wt 196 lb (88.905 kg)  BMI 31.65 kg/m2 Gen: NAD, resting comfortably HEENT: Mucous membranes are moist. Oropharynx normal. Good dentition Neck: no thyromegaly. No lymphadenopathy.  CV: RRR no murmurs rubs or gallops Lungs: CTAB no crackles, wheeze, rhonchi Abdomen: soft/nontender/nondistended/normal bowel sounds. No rebound or guarding.  Ext: no edema Skin: warm, dry, multiple seborrheic keratosis. 2 somewhat flatter lesions on right upper back-1 darker but regular borders and one with slightly irregular  borders but under 6mm to follow.  multiple mild erythematous patches with mild scaling on trunk. Prominent right shoulder, right flank, left upper back/neck.  Neuro: grossly normal, moves all extremities, PERRLA   Had planned for rectal but this was not completed-complete next physical.  Assessment/Plan:  59 y.o. male presenting for annual physical.  Health Maintenance counseling: 1. Anticipatory guidance: Patient counseled regarding regular dental exams, wearing seatbelts, wearing sunscreen, yearly eye exams (wears contacts) 2. Risk factor reduction:  Advised patient of need for regular exercise and diet rich and fruits and vegetables to reduce risk of heart attack and stroke.  3. Immunizations/screenings/ancillary studies- up to date 4. Several SK on back. Keep an eye on 2 on left upper back. Patient does not have dermatologist and we discussed considering.   Essential hypertension Continue ramipril  as well controlled at home  White coat element to in office pressures. Home cuff examined 07/24/14. Runs approximately 20 points higher systolic, with similar diastolic. Home readings all excellent on ramipril .    Erectile dysfunction Trial stendra. May need to check testosterone again with history 2011 being low.    Rash and nonspecific skin eruption Thought to be pityriasis rosea last visit with Dr. Selena Batten in 02/2014. Responded to steroids but later recurred. Trial steroids again for his itching and add topical emmollient as xerosis could be contributing. Discussed if this does not clear area (typically should have resolved within 3 months) then would refer to dermatology   1 year follow up or prn.   Meds ordered this encounter  Medications  . triamcinolone cream (KENALOG) 0.1 %    Sig: Apply 1 application topically 2 (two) times daily. For 2 weeks maximum    Dispense:  80 g    Refill:  0  . Avanafil (STENDRA) 100 MG TABS    Sig: Take 50-100 mg by mouth daily as needed (if  50 mg, 30 minutes before sex, if , 15 minutes before sex).    Dispense:  3 tablet    Refill:  0  . ramipril (ALTACE) 10 MG capsule    Sig: Take 1 capsule by mouth  daily    Dispense:  90 capsule    Refill:  3

## 2014-07-24 NOTE — Assessment & Plan Note (Signed)
Continue ramipril 10mg  as well controlled at home  White coat element to in office pressures. Home cuff examined 07/24/14. Runs approximately 20 points higher systolic, with similar diastolic. Home readings all excellent on ramipril 10mg .

## 2014-07-25 ENCOUNTER — Telehealth: Payer: Self-pay | Admitting: Family Medicine

## 2014-07-25 NOTE — Telephone Encounter (Signed)
emmi emailed °

## 2014-09-14 ENCOUNTER — Encounter: Payer: Self-pay | Admitting: Family Medicine

## 2014-09-14 DIAGNOSIS — R21 Rash and other nonspecific skin eruption: Secondary | ICD-10-CM

## 2014-10-22 ENCOUNTER — Other Ambulatory Visit: Payer: Self-pay | Admitting: Family Medicine

## 2015-02-08 ENCOUNTER — Encounter: Payer: Self-pay | Admitting: Gastroenterology

## 2015-03-12 ENCOUNTER — Other Ambulatory Visit: Payer: Self-pay | Admitting: Family Medicine

## 2015-03-13 ENCOUNTER — Other Ambulatory Visit: Payer: Self-pay

## 2015-03-13 MED ORDER — AVANAFIL 100 MG PO TABS: 50.0000 mg | ORAL_TABLET | Freq: Every day | ORAL | Status: DC | PRN

## 2015-04-20 ENCOUNTER — Other Ambulatory Visit: Payer: Self-pay | Admitting: Family Medicine

## 2015-05-02 ENCOUNTER — Ambulatory Visit (INDEPENDENT_AMBULATORY_CARE_PROVIDER_SITE_OTHER): Payer: Commercial Managed Care - PPO | Admitting: Family Medicine

## 2015-05-02 DIAGNOSIS — Z23 Encounter for immunization: Secondary | ICD-10-CM | POA: Diagnosis not present

## 2015-07-19 ENCOUNTER — Other Ambulatory Visit: Payer: Self-pay | Admitting: Family Medicine

## 2015-11-06 ENCOUNTER — Encounter: Payer: Self-pay | Admitting: Family Medicine

## 2015-11-06 ENCOUNTER — Other Ambulatory Visit: Payer: Self-pay | Admitting: Family Medicine

## 2015-11-06 DIAGNOSIS — Z0000 Encounter for general adult medical examination without abnormal findings: Secondary | ICD-10-CM

## 2015-11-13 ENCOUNTER — Other Ambulatory Visit (INDEPENDENT_AMBULATORY_CARE_PROVIDER_SITE_OTHER): Payer: Commercial Managed Care - PPO

## 2015-11-13 DIAGNOSIS — Z0000 Encounter for general adult medical examination without abnormal findings: Secondary | ICD-10-CM | POA: Diagnosis not present

## 2015-11-13 DIAGNOSIS — R319 Hematuria, unspecified: Secondary | ICD-10-CM

## 2015-11-13 LAB — CBC WITH DIFFERENTIAL/PLATELET
BASOS PCT: 0.5 % (ref 0.0–3.0)
Basophils Absolute: 0 10*3/uL (ref 0.0–0.1)
EOS ABS: 0.3 10*3/uL (ref 0.0–0.7)
EOS PCT: 5.3 % — AB (ref 0.0–5.0)
HCT: 43.8 % (ref 39.0–52.0)
Hemoglobin: 15.2 g/dL (ref 13.0–17.0)
LYMPHS ABS: 1.7 10*3/uL (ref 0.7–4.0)
Lymphocytes Relative: 33.2 % (ref 12.0–46.0)
MCHC: 34.6 g/dL (ref 30.0–36.0)
MCV: 91.3 fl (ref 78.0–100.0)
MONO ABS: 0.3 10*3/uL (ref 0.1–1.0)
Monocytes Relative: 6.7 % (ref 3.0–12.0)
NEUTROS PCT: 54.3 % (ref 43.0–77.0)
Neutro Abs: 2.8 10*3/uL (ref 1.4–7.7)
PLATELETS: 192 10*3/uL (ref 150.0–400.0)
RBC: 4.8 Mil/uL (ref 4.22–5.81)
RDW: 13.1 % (ref 11.5–15.5)
WBC: 5.1 10*3/uL (ref 4.0–10.5)

## 2015-11-13 LAB — POC URINALSYSI DIPSTICK (AUTOMATED)
BILIRUBIN UA: NEGATIVE
Glucose, UA: NEGATIVE
Ketones, UA: NEGATIVE
LEUKOCYTES UA: NEGATIVE
NITRITE UA: NEGATIVE
PH UA: 6
PROTEIN UA: NEGATIVE
Spec Grav, UA: <=1.005
UROBILINOGEN UA: 0.2

## 2015-11-13 LAB — HEPATIC FUNCTION PANEL
ALT: 28 U/L (ref 0–53)
AST: 21 U/L (ref 0–37)
Albumin: 4.3 g/dL (ref 3.5–5.2)
Alkaline Phosphatase: 49 U/L (ref 39–117)
BILIRUBIN DIRECT: 0.2 mg/dL (ref 0.0–0.3)
BILIRUBIN TOTAL: 0.7 mg/dL (ref 0.2–1.2)
Total Protein: 6.8 g/dL (ref 6.0–8.3)

## 2015-11-13 LAB — LIPID PANEL
CHOL/HDL RATIO: 2
CHOLESTEROL: 91 mg/dL (ref 0–200)
HDL: 51.1 mg/dL (ref >39.00)
LDL Cholesterol: 29 mg/dL (ref 0–99)
NonHDL: 39.41
TRIGLYCERIDES: 50 mg/dL (ref 0.0–149.0)
VLDL: 10 mg/dL (ref 0.0–40.0)

## 2015-11-13 LAB — BASIC METABOLIC PANEL
BUN: 14 mg/dL (ref 6–23)
CHLORIDE: 103 meq/L (ref 96–112)
CO2: 28 mEq/L (ref 19–32)
CREATININE: 0.99 mg/dL (ref 0.40–1.50)
Calcium: 9.6 mg/dL (ref 8.4–10.5)
GFR: 81.54 mL/min (ref >60.00)
GLUCOSE: 103 mg/dL — AB (ref 70–99)
Potassium: 4.5 mEq/L (ref 3.5–5.1)
Sodium: 139 mEq/L (ref 135–145)

## 2015-11-13 LAB — PSA: PSA: 0.46 ng/mL (ref 0.10–4.00)

## 2015-11-13 LAB — URINALYSIS, MICROSCOPIC ONLY
RBC / HPF: NONE SEEN (ref 0–?)
WBC, UA: NONE SEEN (ref 0–?)

## 2015-11-13 LAB — TSH: TSH: 1.22 u[IU]/mL (ref 0.35–4.50)

## 2015-11-14 LAB — HEPATITIS C ANTIBODY: HCV Ab: NEGATIVE

## 2015-11-20 ENCOUNTER — Ambulatory Visit (INDEPENDENT_AMBULATORY_CARE_PROVIDER_SITE_OTHER): Payer: Commercial Managed Care - PPO | Admitting: Family Medicine

## 2015-11-20 ENCOUNTER — Encounter: Payer: Self-pay | Admitting: Family Medicine

## 2015-11-20 VITALS — BP 148/84 | HR 84 | Temp 98.5°F | Ht 65.75 in | Wt 189.0 lb

## 2015-11-20 DIAGNOSIS — I10 Essential (primary) hypertension: Secondary | ICD-10-CM | POA: Diagnosis not present

## 2015-11-20 DIAGNOSIS — R739 Hyperglycemia, unspecified: Secondary | ICD-10-CM | POA: Insufficient documentation

## 2015-11-20 DIAGNOSIS — R6889 Other general symptoms and signs: Secondary | ICD-10-CM

## 2015-11-20 DIAGNOSIS — Z1283 Encounter for screening for malignant neoplasm of skin: Secondary | ICD-10-CM

## 2015-11-20 DIAGNOSIS — Z0001 Encounter for general adult medical examination with abnormal findings: Secondary | ICD-10-CM

## 2015-11-20 NOTE — Assessment & Plan Note (Signed)
Home cuff has been verified. Bp elevated in office but looks excellent at home. Continue ramipril 10mg  alone.

## 2015-11-20 NOTE — Progress Notes (Signed)
Phone: [phone]  Subjective:  Patient presents today for their annual physical. Chief complaint-noted.   See problem oriented charting- ROS- full  review of systems was completed and negative including No chest pain or shortness of breath. No headache or blurry vision.   The following were reviewed and entered/updated in epic: Past Medical History  Diagnosis Date  . Hypertension    Patient Active Problem List   Diagnosis Date Noted  . Hyperglycemia 11/20/2015    Priority: Medium  . Essential hypertension 05/19/2007    Priority: Medium  . Rash and nonspecific skin eruption 07/24/2014    Priority: Low  . Erectile dysfunction 04/20/2014    Priority: Low  . Testicular hypofunction 03/21/2010    Priority: Low   Past Surgical History  Procedure Laterality Date  . None      Family History  Problem Relation Age of Onset  . Diabetes Mother   . Hypertension Mother   . Heart disease Father     cabg x2, heavy smoker  . Hyperlipidemia Father   . Diabetes Father   . Mental illness Father     dementia    Medications- reviewed and updated Current Outpatient Prescriptions  Medication Sig Dispense Refill  . aspirin 81 MG tablet Take 81 mg by mouth daily.      . Avanafil (STENDRA) 100 MG TABS Take 50-100 mg by mouth daily as needed (if 50 mg, 30 minutes before sex, if 100mg , 15 minutes before sex). 10 tablet 2  . Multiple Vitamin (MULTIVITAMIN) tablet Take 1 tablet by mouth daily.      . ramipril (ALTACE) 10 MG capsule Take 1 capsule by mouth  daily 90 capsule 3   No current facility-administered medications for this visit.    Allergies-reviewed and updated Allergies  Allergen Reactions  . Penicillins     REACTION: reaction when 60 years old    Social History   Social History  . Marital Status: Married    Spouse Name: N/A  . Number of Children: N/A  . 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  Smoking status: Never Smoker   . Smokeless tobacco: None  . Alcohol Use: Yes     Comment: 6 beers a year  . Drug Use: No  . Sexual Activity: Not Asked   Other Topics Concern  . None   Social History Narrative   Married 2nd wife. 2 kids - healthy. 3 grandkids, last born December 2015. [place]   [employer] [place].       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           Objective: BP 148/84 mmHg  Pulse 84  Temp(Src) 98.5 F (36.9 C) (Oral)  Ht 5' 5.75" (1.67 m)  Wt 189 lb (85.73 kg)  BMI 30.74 kg/m2 Gen: NAD, resting comfortably HEENT: Mucous membranes are moist. Oropharynx normal Neck: no thyromegaly CV: RRR no murmurs rubs or gallops Lungs: CTAB no crackles, wheeze, rhonchi Abdomen: soft/nontender/nondistended/normal bowel sounds. No rebound or guarding.  Rectal: normal tone, normal size prostate, no masses or tenderness  Ext: no edema Skin: warm, dry, 8 mm lesion in mid to upper right back increased from around 6 mm last year Neuro: grossly normal, moves all extremities, PERRLA  Assessment/Plan:  60 y.o. male presenting for annual physical.  Health Maintenance counseling: 1. Anticipatory guidance: Patient counseled regarding regular dental exams, eye exams, wearing seatbelts.  2. Risk factor reduction:  Advised patient of need for regular exercise (into a walking routine now 40 minutes nightly) and diet rich and fruits and vegetables to reduce risk of heart attack and stroke. Diet very reasonable.  3. Immunizations/screenings/ancillary studies Immunization History  Administered Date(s) Administered  . Influenza Split 04/29/2011, 04/23/2012  . Influenza Whole 03/21/2010  . Influenza,inj,Quad PF,36+ Mos 04/15/2013, 04/20/2014, 05/02/2015  . Td 12/28/2007  . Tdap 12/28/2007  . Zoster 06/28/2012   Health Maintenance Due  Topic Date Due  . HIV Screening - declined   04/29/1971   4. Prostate cancer screening- low risked based off PSA trend and rectal   Lab Results  Component Value Date   PSA 0.46 11/13/2015   PSA 0.44 07/17/2014   PSA 0.51 07/22/2013   5. Colon cancer screening - normal 09/01/2007 with repeat in 2019 6. Skin cancer screening-  Screening for skin cancer - Plan: Ambulatory referral to Dermatology- 8 mm lesion on mid to right upper back want evaluated   Status of chronic or acute concerns   1. Hypertension- controlled on ramipril. Also on aspirin. Running around 130/70s at home consistently. Confirmed home cuff last year.  2. ED- has needed stendra or similar since starting ramipril. Best tolerated so far  3. Hyperglycemia- CBG 103 2 years in a row. Weight has come down- making some positive changes. Also going to cut out juices.  Wt Readings from Last 3 Encounters:  11/20/15 189 lb (85.73 kg)  07/24/14 196 lb (88.905 kg)  04/20/14 193 lb (87.544 kg)    Essential hypertension Home cuff has been verified. Bp elevated in office but looks excellent at home. Continue ramipril  alone.    1 year cpeReturn precautions advised.   Orders Placed This Encounter  Procedures  . Ambulatory referral to Dermatology    Referral Priority:  Routine    Referral Type:  Consultation    Referral Reason:  Specialty Services Required    Requested Specialty:  Dermatology    Number of Visits Requested:  1   Tana Conch, MD

## 2015-11-20 NOTE — Patient Instructions (Addendum)
Great job on weight loss and likely preventing worsening of sugar levels  Blood pressure looks great at home - continue to monitor. Consider bringing cuff again next year so we can recheck  We will call you within a week about your referral to dermatology. If you do not hear within 2 weeks, give us a call.   Hope you have a great vacation!

## 2016-03-16 ENCOUNTER — Encounter: Payer: Self-pay | Admitting: Family Medicine

## 2016-03-18 ENCOUNTER — Other Ambulatory Visit: Payer: Self-pay

## 2016-03-18 MED ORDER — AVANAFIL 100 MG PO TABS: 50.0000 mg | ORAL_TABLET | Freq: Every day | ORAL | 2 refills | Status: DC | PRN

## 2016-03-20 ENCOUNTER — Other Ambulatory Visit: Payer: Self-pay

## 2016-03-20 MED ORDER — AVANAFIL 100 MG PO TABS: 50.0000 mg | ORAL_TABLET | Freq: Every day | ORAL | 2 refills | Status: DC | PRN

## 2016-04-23 ENCOUNTER — Ambulatory Visit (INDEPENDENT_AMBULATORY_CARE_PROVIDER_SITE_OTHER): Payer: Commercial Managed Care - PPO

## 2016-04-23 DIAGNOSIS — Z23 Encounter for immunization: Secondary | ICD-10-CM

## 2016-06-04 ENCOUNTER — Encounter: Payer: Self-pay | Admitting: Family Medicine

## 2016-06-05 ENCOUNTER — Other Ambulatory Visit: Payer: Self-pay

## 2016-06-05 MED ORDER — TRIAMCINOLONE ACETONIDE 0.1 % EX CREA: 1.0000 "application " | TOPICAL_CREAM | Freq: Two times a day (BID) | CUTANEOUS | 1 refills | Status: DC

## 2016-07-26 ENCOUNTER — Other Ambulatory Visit: Payer: Self-pay | Admitting: Family Medicine

## 2017-01-09 ENCOUNTER — Other Ambulatory Visit: Payer: Self-pay | Admitting: Family Medicine

## 2017-01-30 ENCOUNTER — Ambulatory Visit (INDEPENDENT_AMBULATORY_CARE_PROVIDER_SITE_OTHER): Payer: Commercial Managed Care - PPO | Admitting: Family Medicine

## 2017-01-30 ENCOUNTER — Encounter: Payer: Self-pay | Admitting: Family Medicine

## 2017-01-30 VITALS — BP 142/86 | HR 66 | Temp 98.5°F | Ht 65.0 in | Wt 187.0 lb

## 2017-01-30 DIAGNOSIS — Z0000 Encounter for general adult medical examination without abnormal findings: Secondary | ICD-10-CM

## 2017-01-30 DIAGNOSIS — I10 Essential (primary) hypertension: Secondary | ICD-10-CM

## 2017-01-30 DIAGNOSIS — Z125 Encounter for screening for malignant neoplasm of prostate: Secondary | ICD-10-CM

## 2017-01-30 LAB — LIPID PANEL
CHOL/HDL RATIO: 2
Cholesterol: 97 mg/dL (ref 0–200)
HDL: 55.6 mg/dL (ref >39.00)
LDL CALC: 31 mg/dL (ref 0–99)
NonHDL: 41.37
Triglycerides: 53 mg/dL (ref 0.0–149.0)
VLDL: 10.6 mg/dL (ref 0.0–40.0)

## 2017-01-30 LAB — CBC
HEMATOCRIT: 45.6 % (ref 39.0–52.0)
HEMOGLOBIN: 15.9 g/dL (ref 13.0–17.0)
MCHC: 34.8 g/dL (ref 30.0–36.0)
MCV: 91.1 fl (ref 78.0–100.0)
Platelets: 194 10*3/uL (ref 150.0–400.0)
RBC: 5.01 Mil/uL (ref 4.22–5.81)
RDW: 12.8 % (ref 11.5–15.5)
WBC: 5.7 10*3/uL (ref 4.0–10.5)

## 2017-01-30 LAB — COMPREHENSIVE METABOLIC PANEL
ALK PHOS: 55 U/L (ref 39–117)
ALT: 28 U/L (ref 0–53)
AST: 22 U/L (ref 0–37)
Albumin: 4.6 g/dL (ref 3.5–5.2)
BUN: 15 mg/dL (ref 6–23)
CHLORIDE: 102 meq/L (ref 96–112)
CO2: 27 meq/L (ref 19–32)
Calcium: 9.6 mg/dL (ref 8.4–10.5)
Creatinine, Ser: 1.04 mg/dL (ref 0.40–1.50)
GFR: 76.72 mL/min (ref >60.00)
GLUCOSE: 112 mg/dL — AB (ref 70–99)
Potassium: 4.1 mEq/L (ref 3.5–5.1)
SODIUM: 138 meq/L (ref 135–145)
TOTAL PROTEIN: 7.5 g/dL (ref 6.0–8.3)
Total Bilirubin: 1 mg/dL (ref 0.2–1.2)

## 2017-01-30 LAB — PSA: PSA: 0.66 ng/mL (ref 0.10–4.00)

## 2017-01-30 MED ORDER — RAMIPRIL 10 MG PO CAPS: 10.0000 mg | ORAL_CAPSULE | Freq: Every day | ORAL | 3 refills | Status: DC

## 2017-01-30 NOTE — Progress Notes (Signed)
Phone: [phone]  Subjective:  Patient presents today for their annual physical. Chief complaint-noted.   See problem oriented charting- ROS- full  review of systems was completed and negative including No chest pain or shortness of breath. No headache or blurry vision.   The following were reviewed and entered/updated in epic: Past Medical History:  Diagnosis Date  . Hypertension    Patient Active Problem List   Diagnosis Date Noted  . Hyperglycemia 11/20/2015    Priority: Medium  . Essential hypertension 05/19/2007    Priority: Medium  . Rash and nonspecific skin eruption 07/24/2014    Priority: Low  . Erectile dysfunction 04/20/2014    Priority: Low  . Testicular hypofunction 03/21/2010    Priority: Low   Past Surgical History:  Procedure Laterality Date  . none      Family History  Problem Relation Age of Onset  . Diabetes Mother   . Hypertension Mother   . Heart disease Father        cabg x2, heavy smoker  . Hyperlipidemia Father   . Diabetes Father   . Mental illness Father        dementia    Medications- reviewed and updated Current Outpatient Prescriptions  Medication Sig Dispense Refill  . aspirin 81 MG tablet Take 81 mg by mouth daily.      . Avanafil (STENDRA) 100 MG TABS Take 50-100 mg by mouth daily as needed (if 50 mg, 30 minutes before sex, if 100mg , 15 minutes before sex). 10 tablet 2  . Multiple Vitamin (MULTIVITAMIN) tablet Take 1 tablet by mouth daily.      . ramipril (ALTACE) 10 MG capsule Take 1 capsule (10 mg total) by mouth daily. 90 capsule 3  . triamcinolone cream (KENALOG) 0.1 % Apply 1 application topically 2 (two) times daily. 30 g 1   No current facility-administered medications for this visit.     Allergies-reviewed and updated Allergies  Allergen Reactions  . Penicillins     REACTION: reaction when 61 years old    Social History   Social History  . Marital status: Married    Spouse name: N/A  . Number of children:  N/A  . Years of education: N/A   Occupational History  . CFO [employer]   Social History Main Topics  . Smoking status: Never Smoker  . Smokeless tobacco: Never Used  . Alcohol use Yes     Comment: 6 beers a year  . Drug use: No  . Sexual activity: Not Asked   Other Topics Concern  . None   Social History Narrative   Married 2nd wife. 2 kids - healthy. 3 grandkids, last born December 2015. [place]   [employer] [place].       CFO [employer]      Hobbies: ride bike [place] [address], sports-[employer] [place], [place] , grandkids, reading.           Objective: BP (!) 142/86   Pulse 66   Temp 98.5 F (36.9 C) (Oral)   Ht 5\' 5"  (1.651 m)   Wt 187 lb (84.8 kg)   SpO2 98%   BMI 31.12 kg/m  Gen: NAD, resting comfortably HEENT: Mucous membranes are moist. Oropharynx normal Neck: no thyromegaly CV: RRR no murmurs rubs or gallops Lungs: CTAB no crackles, wheeze, rhonchi Abdomen: soft/nontender/nondistended/normal bowel sounds. No rebound or guarding.  Ext: no edema Skin: warm, dry Neuro: grossly normal, moves all extremities,  PERRLA Rectal: normal tone, normal sized prostate, no masses or tenderness  Assessment/Plan:  61 y.o. male presenting for annual physical.  Health Maintenance counseling: 1. Anticipatory guidance: Patient counseled regarding regular dental exams q156months, eye exams yearly with contacts, wearing seatbelts.  2. Risk factor reduction:  Advised patient of need for regular exercise and diet rich and fruits and vegetables to reduce risk of heart attack and stroke. Exercise- walking daily if weather permits. Diet-watching diet, balanced, cut out drinking juice- reading more labelsh.  Wt Readings from Last 3 Encounters:  01/30/17 187 lb (84.8 kg)  11/20/15 189 lb (85.7 kg)  07/24/14 196 lb (88.9 kg)  3. Immunizations/screenings/ancillary studies- Singrix planned for next year due to current  shortage Immunization History  Administered Date(s) Administered  . Influenza Split 04/29/2011, 04/23/2012  . Influenza Whole 03/21/2010  . Influenza,inj,Quad PF,36+ Mos 04/15/2013, 04/20/2014, 05/02/2015, 04/23/2016  . Td 12/28/2007  . Tdap 12/28/2007  . Zoster 06/28/2012  4. Prostate cancer screening-  prior low risk trend PSA- update today. Rectal exam low risk.  Lab Results  Component Value Date   PSA 0.46 11/13/2015   PSA 0.44 07/17/2014   PSA 0.51 07/22/2013   5. Colon cancer screening - 09/01/2007 with repeat in 2019.  6. Skin cancer screening- mid to right upper back lesion- referred to dermatology last year- was told seborrheic keratosis.   Status of chronic or acute concerns   Stendra for ED  Hyperglycemia- update cbg today  Essential hypertension S: controlled at home on ramipril 10mg  with numbers 130-135/75. Continued poor control in office. Have verified home cuff.  BP Readings from Last 3 Encounters:  01/30/17 (!) 140/96  11/20/15 (!) 148/84  07/24/14 (!) 158/88  A/P: We discussed blood pressure goal of <140/90. Continue current meds  1 year CPE as long as home BP <140/90  Orders Placed This Encounter  Procedures  . CBC    [place]  . Comprehensive metabolic panel    [facility]    Order Specific Question:   Has the patient fasted?    Answer:   No  . Lipid panel    [place]    Order Specific Question:   Has the patient fasted?    Answer:   No  . PSA    Meds ordered this encounter  Medications  . ramipril (ALTACE) 10 MG capsule    Sig: Take 1 capsule (10 mg total) by mouth daily.    Dispense:  90 capsule    Refill:  3    Return precautions advised.   Tana ConchStephen Zamarian Scarano, MD

## 2017-01-30 NOTE — Assessment & Plan Note (Signed)
S: controlled at home on ramipril 10mg  with numbers 130-135/75. Continued poor control in office. Have verified home cuff.  BP Readings from Last 3 Encounters:  01/30/17 (!) 140/96  11/20/15 (!) 148/84  07/24/14 (!) 158/88  A/P: We discussed blood pressure goal of <140/90. Continue current meds

## 2017-01-30 NOTE — Patient Instructions (Addendum)
Shingrix planned for next year due to current shortage  Please stop by lab before you go  Hopeful blood sugar looks better with your continued improvement in weight and continued exercise  <140/90 for blood pressure in order to not increase medications but you are right closer to 130/80 is even better and you are at goal with that at home.   Let us know if you need something different then shingrix

## 2017-03-03 DIAGNOSIS — H524 Presbyopia: Secondary | ICD-10-CM | POA: Diagnosis not present

## 2017-03-03 DIAGNOSIS — H5213 Myopia, bilateral: Secondary | ICD-10-CM | POA: Diagnosis not present

## 2017-03-03 DIAGNOSIS — H04123 Dry eye syndrome of bilateral lacrimal glands: Secondary | ICD-10-CM | POA: Diagnosis not present

## 2017-04-09 ENCOUNTER — Ambulatory Visit (INDEPENDENT_AMBULATORY_CARE_PROVIDER_SITE_OTHER): Payer: Commercial Managed Care - PPO

## 2017-04-09 DIAGNOSIS — Z23 Encounter for immunization: Secondary | ICD-10-CM

## 2017-05-10 ENCOUNTER — Encounter: Payer: Self-pay | Admitting: Family Medicine

## 2017-05-13 ENCOUNTER — Other Ambulatory Visit: Payer: Self-pay

## 2017-05-13 MED ORDER — SILDENAFIL CITRATE 100 MG PO TABS: 100.0000 mg | ORAL_TABLET | Freq: Every day | ORAL | 1 refills | Status: DC | PRN

## 2017-06-04 ENCOUNTER — Encounter: Payer: Self-pay | Admitting: Family Medicine

## 2017-06-04 ENCOUNTER — Other Ambulatory Visit: Payer: Self-pay

## 2017-06-04 MED ORDER — TRIAMCINOLONE ACETONIDE 0.1 % EX CREA: 1.0000 "application " | TOPICAL_CREAM | Freq: Two times a day (BID) | CUTANEOUS | 1 refills | Status: DC

## 2017-06-26 ENCOUNTER — Other Ambulatory Visit: Payer: Self-pay | Admitting: Family Medicine

## 2017-08-28 ENCOUNTER — Encounter: Payer: Self-pay | Admitting: Family Medicine

## 2017-08-28 ENCOUNTER — Encounter: Payer: Self-pay | Admitting: Gastroenterology

## 2017-09-10 ENCOUNTER — Encounter: Payer: Self-pay | Admitting: Family Medicine

## 2017-11-16 ENCOUNTER — Encounter: Payer: Self-pay | Admitting: Family Medicine

## 2017-11-16 ENCOUNTER — Ambulatory Visit: Payer: 59 | Admitting: Family Medicine

## 2017-11-16 VITALS — BP 142/92 | HR 85 | Temp 98.3°F | Ht 65.0 in | Wt 190.2 lb

## 2017-11-16 DIAGNOSIS — L600 Ingrowing nail: Secondary | ICD-10-CM | POA: Diagnosis not present

## 2017-11-16 DIAGNOSIS — I10 Essential (primary) hypertension: Secondary | ICD-10-CM

## 2017-11-16 DIAGNOSIS — Z23 Encounter for immunization: Secondary | ICD-10-CM | POA: Diagnosis not present

## 2017-11-16 NOTE — Progress Notes (Signed)
Subjective:  Douglas Delgado is a 62 y.o. year old very pleasant male patient who presents for/with See problem oriented charting ROS- no expanding redness. admits to toe pain of great toe on right foot.  No fever or chills.  No chest pain.  Past Medical History-  Patient Active Problem List   Diagnosis Date Noted  . Hyperglycemia 11/20/2015    Priority: Medium  . Essential hypertension 05/19/2007    Priority: Medium  . Rash and nonspecific skin eruption 07/24/2014    Priority: Low  . Erectile dysfunction 04/20/2014    Priority: Low  . Testicular hypofunction 03/21/2010    Priority: Low    Medications- reviewed and updated Current Outpatient Medications  Medication Sig Dispense Refill  . aspirin 81 MG tablet Take 81 mg by mouth daily.      . Avanafil (STENDRA) 100 MG TABS Take 50-100 mg by mouth daily as needed (if 50 mg, 30 minutes before sex, if , 15 minutes before sex). 10 tablet 2  . Multiple Vitamin (MULTIVITAMIN) tablet Take 1 tablet by mouth daily.      . ramipril (ALTACE) 10 MG capsule TAKE 1 CAPSULE BY MOUTH  DAILY 90 capsule 3  . sildenafil (VIAGRA) 100 MG tablet Take 1 tablet (100 mg total) daily as needed by mouth for erectile dysfunction. 18 tablet 1  . triamcinolone cream (KENALOG) 0.1 % Apply 1 application topically 2 (two) times daily. 30 g 1   No current facility-administered medications for this visit.     Objective: BP (!) 142/92 (BP Location: Left Arm, Patient Position: Sitting, Cuff Size: Normal)   Pulse 85   Temp 98.3 F (36.8 C) (Oral)   Ht  (1.651 m)   Wt 190 lb 3.2 oz (86.3 kg)   SpO2 96%   BMI 31.65 kg/m   Gen: NAD, resting comfortably CV: RRR  Lungs: nonlabored, regular rate Abdomen: soft/nontender Ext: no edema Skin: warm, dry, right great lateral toenail with some purplish discoloration but no flutuance- not able to express any purulent material. Ingrown toenail noted- nail somewhat brittle in area. No surrounding erythema, warmth. No  pain with palpation     Assessment/Plan:  Other notes: 1.He asks about measles immunization.  He was born in 1957 and we reviewed CDC recommendations.  He believes he actually had the measles.  We discussed that his age  likely infers immunity 2.  He asks about Shingrix.  Luckily we have this in stock today.  We will have the second immunization held for him.    ingrown toenail right great toe S: several weeks of ingrown toenail right great toe. Seemed to be getting better then noted purplish discoloration on Friday. Then he has felt some tinges of pain in foot as well going from toe up underneath foot. Minimal pain but does admit he favors the foot and may be walking slightly different A/P: We discussed conservative care.Patient also seems interested in more definitive treatment.  we discussed if he is not improving within 2 weeks or symptoms worsen-  Would get him in for a 30-minute procedure visit with Dr. Earlene Plater for lateral third nail excision  From avs for conservative care "Follow these instructions at home:  Soak your foot in warm soapy water for at least 5 minutes (10-20 minutes if you can), 2-3 times per day  Carefully lift the edge of the nail away from the sore skin by wedging the back of your clippers or a small piece of cotton under the corner  of the nail. This may help with the pain. Be careful not to cause more injury to the area. I am slightly concerned about your nail with how brittle it is though.   Wear shoes that fit well. If your ingrown toenail is causing you pain, try wearing sandals, if possible.  Trim your toenails regularly and carefully. Do not cut them in a curved shape. Cut your toenails straight across. This prevents injury to the skin at the corners of the toenail. Keep your feet clean and dry."  Essential hypertension S: controlled on ramipril  at home - #s still all in 130s and <80 diastolic BP Readings from Last 3 Encounters:  11/16/17 (!) 142/92    01/30/17 (!) 142/86  11/20/15 (!) 148/84  A/P: We discussed blood pressure goal of <140/90. Continue current meds: Once again excellent control at home-has whitecoat hypertension in addition to baseline hypertension  He asks about coming off aspirin-without excellent his lipids wereOn last check. We agreed to stop the aspirin.  Future Appointments  Date Time Provider Department Center  02/08/2018  8:15 AM Shelva Majestic, MD [facility]   Final Shingrix at physical. Lab/Order associations: Need for prophylactic vaccination and inoculation against varicella - Plan: Varicella-zoster vaccine IM (Shingrix)  Return precautions advised.  Tana Conch, MD

## 2017-11-16 NOTE — Assessment & Plan Note (Signed)
S: controlled on ramipril  at home - #s still all in 130s and <80 diastolic BP Readings from Last 3 Encounters:  11/16/17 (!) 142/92  01/30/17 (!) 142/86  11/20/15 (!) 148/84  A/P: We discussed blood pressure goal of <140/90. Continue current meds: Once again excellent control at home-has whitecoat hypertension in addition to baseline hypertension  He asks about coming off aspirin-without excellent his lipids wereOn last check. We agreed to stop the aspirin.

## 2017-11-16 NOTE — Patient Instructions (Addendum)
Health Maintenance Due  Topic Date Due  . COLONOSCOPY - Will set up cologuard at physical in August. 08/31/2017   Shingrix #1 today. Repeat injection in 2-3 months. Schedule this before you leave (actually could do 2nd shot at physical)  Stop aspirin  You are immune to measles based off year of birth   Follow these instructions at home:  Soak your foot in warm soapy water for at least 5 minutes (10-20 minutes if you can), 2-3 times per day  Carefully lift the edge of the nail away from the sore skin by wedging the back of your clippers or a small piece of cotton under the corner of the nail. This may help with the pain. Be careful not to cause more injury to the area. I am slightly concerned about your nail with how brittle it is though.   Wear shoes that fit well. If your ingrown toenail is causing you pain, try wearing sandals, if possible.  Trim your toenails regularly and carefully. Do not cut them in a curved shape. Cut your toenails straight across. This prevents injury to the skin at the corners of the toenail.  Keep your feet clean and dry.  Dr. Earlene Plater will be happy to see you to remove the lateral third nail if you are not improving within the next 2 weeks or sooner if symptoms worsen (or if you just get tired of above methods!)  Ingrown Toenail An ingrown toenail occurs when the corner or sides of your toenail grow into the surrounding skin. The big toe is most commonly affected, but it can happen to any of your toes. If your ingrown toenail is not treated, you will be at risk for infection. What are the causes? This condition may be caused by:  Wearing shoes that are too small or tight.  Injury or trauma, such as stubbing your toe or having your toe stepped on.  Improper cutting or care of your toenails.  Being born with (congenital) nail or foot abnormalities, such as having a nail that is too big for your toe.  What increases the risk? Risk factors for an ingrown  toenail include:  Age. Your nails tend to thicken as you get older, so ingrown nails are more common as you age  Diabetes.  Cutting your toenails incorrectly.  Blood circulation problems.  What are the signs or symptoms? Symptoms may include:  Pain, soreness, or tenderness.  Redness.  Swelling.  Hardening of the skin surrounding the toe.  Your ingrown toenail may be infected if there is fluid, pus, or drainage. How is this diagnosed? An ingrown toenail may be diagnosed by medical history and physical exam. If your toenail is infected, your health care provider may test a sample of the drainage. How is this treated? Treatment depends on the severity of your ingrown toenail. Some ingrown toenails may be treated at home. More severe or infected ingrown toenails may require surgery to remove all or part of the nail. Infected ingrown toenails may also be treated with antibiotic medicines.   Contact a health care provider if:  Your symptoms do not improve with treatment. Get help right away if:  You have red streaks that start at your foot and go up your leg.  You have a fever.  You have increased redness, swelling, or pain.  You have fluid, blood, or pus coming from your toenail. This information is not intended to replace advice given to you by your health care provider. Make sure  you discuss any questions you have with your health care provider. Document Released: 06/20/2000 Document Revised: 11/23/2015 Document Reviewed: 05/17/2014 Elsevier Interactive Patient Education  [employer].

## 2017-11-18 ENCOUNTER — Encounter: Payer: Self-pay | Admitting: Family Medicine

## 2017-12-14 ENCOUNTER — Encounter: Payer: Self-pay | Admitting: Family Medicine

## 2018-02-08 ENCOUNTER — Ambulatory Visit (INDEPENDENT_AMBULATORY_CARE_PROVIDER_SITE_OTHER): Payer: 59 | Admitting: Family Medicine

## 2018-02-08 ENCOUNTER — Encounter: Payer: Self-pay | Admitting: Family Medicine

## 2018-02-08 VITALS — BP 146/84 | HR 88 | Temp 98.7°F | Ht 65.0 in | Wt 182.0 lb

## 2018-02-08 DIAGNOSIS — R739 Hyperglycemia, unspecified: Secondary | ICD-10-CM | POA: Diagnosis not present

## 2018-02-08 DIAGNOSIS — Z23 Encounter for immunization: Secondary | ICD-10-CM

## 2018-02-08 DIAGNOSIS — I10 Essential (primary) hypertension: Secondary | ICD-10-CM

## 2018-02-08 DIAGNOSIS — N522 Drug-induced erectile dysfunction: Secondary | ICD-10-CM

## 2018-02-08 DIAGNOSIS — Z125 Encounter for screening for malignant neoplasm of prostate: Secondary | ICD-10-CM | POA: Diagnosis not present

## 2018-02-08 DIAGNOSIS — E785 Hyperlipidemia, unspecified: Secondary | ICD-10-CM

## 2018-02-08 DIAGNOSIS — Z0000 Encounter for general adult medical examination without abnormal findings: Secondary | ICD-10-CM

## 2018-02-08 DIAGNOSIS — Z1211 Encounter for screening for malignant neoplasm of colon: Secondary | ICD-10-CM

## 2018-02-08 LAB — COMPREHENSIVE METABOLIC PANEL
ALBUMIN: 4.6 g/dL (ref 3.5–5.2)
ALK PHOS: 67 U/L (ref 39–117)
ALT: 20 U/L (ref 0–53)
AST: 17 U/L (ref 0–37)
BILIRUBIN TOTAL: 0.9 mg/dL (ref 0.2–1.2)
BUN: 13 mg/dL (ref 6–23)
CALCIUM: 10.1 mg/dL (ref 8.4–10.5)
CO2: 29 mEq/L (ref 19–32)
CREATININE: 0.99 mg/dL (ref 0.40–1.50)
Chloride: 103 mEq/L (ref 96–112)
GFR: 80.95 mL/min (ref >60.00)
Glucose, Bld: 120 mg/dL — ABNORMAL HIGH (ref 70–99)
Potassium: 5 mEq/L (ref 3.5–5.1)
Sodium: 139 mEq/L (ref 135–145)
TOTAL PROTEIN: 7.4 g/dL (ref 6.0–8.3)

## 2018-02-08 LAB — CBC
HCT: 46 % (ref 39.0–52.0)
HEMOGLOBIN: 16.2 g/dL (ref 13.0–17.0)
MCHC: 35.2 g/dL (ref 30.0–36.0)
MCV: 90 fl (ref 78.0–100.0)
PLATELETS: 208 10*3/uL (ref 150.0–400.0)
RBC: 5.11 Mil/uL (ref 4.22–5.81)
RDW: 12.9 % (ref 11.5–15.5)
WBC: 5.3 10*3/uL (ref 4.0–10.5)

## 2018-02-08 LAB — PSA: PSA: 0.78 ng/mL (ref 0.10–4.00)

## 2018-02-08 LAB — LIPID PANEL
CHOL/HDL RATIO: 2
CHOLESTEROL: 90 mg/dL (ref 0–200)
HDL: 50.2 mg/dL (ref >39.00)
LDL Cholesterol: 26 mg/dL (ref 0–99)
NonHDL: 40.23
TRIGLYCERIDES: 73 mg/dL (ref 0.0–149.0)
VLDL: 14.6 mg/dL (ref 0.0–40.0)

## 2018-02-08 LAB — HEMOGLOBIN A1C: Hgb A1c MFr Bld: 5.4 % (ref 4.6–6.5)

## 2018-02-08 MED ORDER — RAMIPRIL 10 MG PO CAPS: 10.0000 mg | ORAL_CAPSULE | Freq: Every day | ORAL | 3 refills | Status: DC

## 2018-02-08 NOTE — Assessment & Plan Note (Signed)
S: controlled at home  on ramipril 10mg . Have validated his home cuff in past and runs higher than our readings- even with that home #s are 130/70 so if you were to adjust likely would be in 110s range.  BP Readings from Last 3 Encounters:  02/08/18 (!) 146/84  11/16/17 (!) 142/92  01/30/17 (!) 142/86  A/P: doing well. Continue current meds:  Ramipril 10mg  alone due to excellent home control

## 2018-02-08 NOTE — Progress Notes (Signed)
Phone: [phone]  Subjective:  Patient presents today for their annual physical. Chief complaint-noted.   See problem oriented charting- ROS- full  review of systems was completed and negative including No chest pain or shortness of breath. No headache or blurry vision.    The following were reviewed and entered/updated in epic: Past Medical History:  Diagnosis Date  . Hypertension    Patient Active Problem List   Diagnosis Date Noted  . Hyperglycemia 11/20/2015    Priority: Medium  . Essential hypertension 05/19/2007    Priority: Medium  . Rash and nonspecific skin eruption 07/24/2014    Priority: Low  . Erectile dysfunction 04/20/2014    Priority: Low  . Testicular hypofunction 03/21/2010    Priority: Low   Past Surgical History:  Procedure Laterality Date  . none      Family History  Problem Relation Age of Onset  . Diabetes Mother   . Hypertension Mother   . Heart disease Father        cabg x2, heavy smoker  . Hyperlipidemia Father   . Diabetes Father   . Mental illness Father        dementia    Medications- reviewed and updated Current Outpatient Medications  Medication Sig Dispense Refill  . Multiple Vitamin (MULTIVITAMIN) tablet Take 1 tablet by mouth daily.      . ramipril (ALTACE) 10 MG capsule Take 1 capsule (10 mg total) by mouth daily. 90 capsule 3   No current facility-administered medications for this visit.     Allergies-reviewed and updated Allergies  Allergen Reactions  . Penicillins     REACTION: reaction when 62 years old    Social History   Social History Narrative   Married 2nd wife. 2 kids - healthy. 3 grandkids, last born December 2015. [place]   [employer].       [employer]      Hobbies: ride bike [place], sports-[employer], [place] , grandkids, reading.        Objective: BP (!) 146/84 (BP Location: Left Arm, Cuff Size: Large)   Pulse 88   Temp 98.7 F (37.1 C)  (Oral)   Ht 5\' 5"  (1.651 m)   Wt 182 lb (82.6 kg)   SpO2 97%   BMI 30.29 kg/m  Gen: NAD, resting comfortably HEENT: Mucous membranes are moist. Oropharynx normal Neck: no thyromegaly CV: RRR no murmurs rubs or gallops Lungs: CTAB no crackles, wheeze, rhonchi Abdomen: soft/nontender/nondistended/normal bowel sounds. No rebound or guarding.  Ext: no edema Skin: warm, dry Neuro: grossly normal, moves all extremities, PERRLA Rectal: normal tone, normal sized prostate, no masses or tenderness  Assessment/Plan:  62 y.o. male presenting for annual physical.  Health Maintenance counseling: 1. Anticipatory guidance: Patient counseled regarding regular dental exams -q6 months. sees Dr. Lucky CowboyKnox q3 months due to gingivitis history, eye exams -yearly due to contact lens use, wearing seatbelts.  2. Risk factor reduction:  Advised patient of need for regular exercise and diet rich and fruits and vegetables to reduce risk of heart attack and stroke. Weight down 5 lbs from last CPE! Exercise- walking 3-4 days a week for 3-4 miles at a time. Diet-trying to eat low sugar diet. Has cut down on processed cereal. Making a batch of home made steel cut oats once a week- along with some fresh fruit and some cinnamon- about a year ago.  Wt Readings from Last 3 Encounters:  02/08/18 182 lb (82.6  kg)  11/16/17 190 lb 3.2 oz (86.3 kg)  01/30/17 187 lb (84.8 kg)  3. Immunizations/screenings/ancillary studies- 2nd shingrix today  Immunization History  Administered Date(s) Administered  . Influenza Split 04/29/2011, 04/23/2012  . Influenza Whole 03/21/2010  . Influenza,inj,Quad PF,6+ Mos 04/15/2013, 04/20/2014, 05/02/2015, 04/23/2016, 04/09/2017  . Td 12/28/2007  . Tdap 12/28/2007, 02/08/2018  . Zoster 06/28/2012  . Zoster Recombinat (Shingrix) 11/16/2017, 02/08/2018   Health Maintenance Due  Topic Date Due  . COLONOSCOPY - wants to do cologuard due to no family history colon cancer and normal colonoscopy in  the past 08/31/2017  . TETANUS/TDAP - today  12/27/2017  . INFLUENZA VACCINE - not yet available in clinic 02/04/2018   4. Prostate cancer screening-  trend psa, prior trend low risk. Rectal exam- low risk Lab Results  Component Value Date   PSA 0.66 01/30/2017   PSA 0.46 11/13/2015   PSA 0.44 07/17/2014   5. Colon cancer screening - see above,  due for repeat this year. Last 08/2007 colonoscopy 6. Skin cancer screening-sees yearly . advised regular sunscreen use. Denies worrisome, changing, or new skin lesions.   Status of chronic or acute concerns   Essential hypertension S: controlled at home  on ramipril 10mg . Have validated his home cuff in past and runs higher than our readings- even with that home #s are 130/70 so if you were to adjust likely would be in 110s range.  BP Readings from Last 3 Encounters:  02/08/18 (!) 146/84  11/16/17 (!) 142/92  01/30/17 (!) 142/86  A/P: doing well. Continue current meds:  Ramipril 10mg  alone due to excellent home control  Hyperglycemia Hyperglycemia- update a1c and cbg. Weight down 5 lbs.   Erectile dysfunction stendra for ED in past. Felt poorly on the medicine and only mildly helped.  Cialis and viagra-felt poorly after taking and no help with erections.   Return in about 1 year (around 02/09/2019) for physical. Filled out work form.   Lab/Order associations: Preventative health care - Plan: CBC, Comprehensive metabolic panel, Lipid panel, PSA, Hemoglobin A1c  Hyperlipidemia, unspecified hyperlipidemia type - Plan: CBC, Comprehensive metabolic panel, Lipid panel  Hyperglycemia - Plan: Comprehensive metabolic panel, Hemoglobin A1c  Screening for prostate cancer - Plan: PSA  Need for prophylactic vaccination with combined diphtheria-tetanus-pertussis (DTP) vaccine - Plan: Tdap vaccine greater than or equal to 7yo IM  Need for prophylactic vaccination and inoculation against varicella - Plan: Varicella-zoster vaccine IM  Screening for  colon cancer - Plan: Cologuard  Essential hypertension  Drug-induced erectile dysfunction  Meds ordered this encounter  Medications  . ramipril (ALTACE) 10 MG capsule    Sig: Take 1 capsule (10 mg total) by mouth daily.    Dispense:  90 capsule    Refill:  3    Return precautions advised.  Tana Conch, MD

## 2018-02-08 NOTE — Patient Instructions (Addendum)
Health Maintenance Due  Topic Date Due  . COLONOSCOPY -ordered Cologuard today 08/31/2017  . INFLUENZA VACCINE - call for a nurse visit in October (we should have the vaccine in by then) 02/04/2018   [place] losing 5 lbs! Great job with regular walking and healthy eating- hoping to see some positive changes in your #s from those changes.   See you in a year as long as home blood pressure remains <140/90-see us sooner if you start to see numbers over that  Please stop by the lab before you leave today

## 2018-02-08 NOTE — Assessment & Plan Note (Signed)
Hyperglycemia- update a1c and cbg. Weight down 5 lbs.

## 2018-02-08 NOTE — Assessment & Plan Note (Signed)
stendra for ED in past. Felt poorly on the medicine and only mildly helped.  Cialis and viagra-felt poorly after taking and no help with erections.

## 2018-02-16 DIAGNOSIS — Z1211 Encounter for screening for malignant neoplasm of colon: Secondary | ICD-10-CM | POA: Diagnosis not present

## 2018-02-16 LAB — COLOGUARD: COLOGUARD: NEGATIVE

## 2018-02-26 ENCOUNTER — Encounter: Payer: Self-pay | Admitting: Family Medicine

## 2018-04-15 ENCOUNTER — Ambulatory Visit (INDEPENDENT_AMBULATORY_CARE_PROVIDER_SITE_OTHER): Payer: 59

## 2018-04-15 DIAGNOSIS — Z23 Encounter for immunization: Secondary | ICD-10-CM | POA: Diagnosis not present

## 2018-04-15 NOTE — Progress Notes (Signed)
I have reviewed the patient's encounter and agree with the documentation.  Douglas Delgado M. [name], MD 04/15/2018 12:26 PM   

## 2018-04-15 NOTE — Progress Notes (Signed)
Patient in today for flu vaccine. VIS given. Administered in left arm. Patient tolerated well

## 2018-04-15 NOTE — Patient Instructions (Signed)
There are no preventive care reminders to display for this patient.  Depression screen [facility] 2/9 02/08/2018 11/20/2015  Decreased Interest 0 0  Down, Depressed, Hopeless 0 0  PHQ - 2 Score 0 0

## 2018-05-07 ENCOUNTER — Encounter: Payer: Self-pay | Admitting: Family Medicine

## 2018-09-05 DEATH — deceased

## 2019-01-06 ENCOUNTER — Other Ambulatory Visit: Payer: Self-pay | Admitting: Family Medicine

## 2019-01-10 NOTE — Telephone Encounter (Signed)
Patient need to schedule an ov for more refills. 

## 2019-01-11 ENCOUNTER — Other Ambulatory Visit: Payer: Self-pay

## 2019-01-11 NOTE — Telephone Encounter (Signed)
Called and spoke with patient. He says that he has plenty of medication for now and is not comfortable coming into the office now d/t pandemic. He will call back before he runs out of medication to schedule CPE or follow-up.

## 2019-01-22 ENCOUNTER — Other Ambulatory Visit: Payer: Self-pay | Admitting: Family Medicine

## 2019-03-01 ENCOUNTER — Encounter: Payer: Self-pay | Admitting: Family Medicine

## 2019-03-20 ENCOUNTER — Other Ambulatory Visit: Payer: Self-pay | Admitting: Family Medicine

## 2019-05-09 DIAGNOSIS — H527 Unspecified disorder of refraction: Secondary | ICD-10-CM | POA: Diagnosis not present

## 2019-05-10 ENCOUNTER — Ambulatory Visit (INDEPENDENT_AMBULATORY_CARE_PROVIDER_SITE_OTHER): Payer: Medicare Other | Admitting: Family Medicine

## 2019-05-10 ENCOUNTER — Other Ambulatory Visit: Payer: Self-pay

## 2019-05-10 ENCOUNTER — Encounter: Payer: Self-pay | Admitting: Family Medicine

## 2019-05-10 VITALS — BP 130/82 | HR 82 | Temp 99.4°F | Ht 65.0 in | Wt 181.6 lb

## 2019-05-10 DIAGNOSIS — R739 Hyperglycemia, unspecified: Secondary | ICD-10-CM | POA: Diagnosis not present

## 2019-05-10 DIAGNOSIS — Z125 Encounter for screening for malignant neoplasm of prostate: Secondary | ICD-10-CM | POA: Diagnosis not present

## 2019-05-10 DIAGNOSIS — I10 Essential (primary) hypertension: Secondary | ICD-10-CM | POA: Diagnosis not present

## 2019-05-10 DIAGNOSIS — Z0000 Encounter for general adult medical examination without abnormal findings: Secondary | ICD-10-CM | POA: Diagnosis not present

## 2019-05-10 DIAGNOSIS — Z23 Encounter for immunization: Secondary | ICD-10-CM

## 2019-05-10 LAB — COMPREHENSIVE METABOLIC PANEL
ALT: 27 U/L (ref 0–53)
AST: 26 U/L (ref 0–37)
Albumin: 4.6 g/dL (ref 3.5–5.2)
Alkaline Phosphatase: 70 U/L (ref 39–117)
BUN: 15 mg/dL (ref 6–23)
CO2: 27 mEq/L (ref 19–32)
Calcium: 9.8 mg/dL (ref 8.4–10.5)
Chloride: 103 mEq/L (ref 96–112)
Creatinine, Ser: 0.95 mg/dL (ref 0.40–1.50)
GFR: 79.56 mL/min (ref >60.00)
Glucose, Bld: 113 mg/dL — ABNORMAL HIGH (ref 70–99)
Potassium: 4.7 mEq/L (ref 3.5–5.1)
Sodium: 138 mEq/L (ref 135–145)
Total Bilirubin: 0.7 mg/dL (ref 0.2–1.2)
Total Protein: 7.3 g/dL (ref 6.0–8.3)

## 2019-05-10 LAB — PSA: PSA: 0.54 ng/mL (ref 0.10–4.00)

## 2019-05-10 LAB — CBC WITH DIFFERENTIAL/PLATELET
Basophils Absolute: 0 10*3/uL (ref 0.0–0.1)
Basophils Relative: 0.5 % (ref 0.0–3.0)
Eosinophils Absolute: 0.1 10*3/uL (ref 0.0–0.7)
Eosinophils Relative: 1.3 % (ref 0.0–5.0)
HCT: 45.7 % (ref 39.0–52.0)
Hemoglobin: 16 g/dL (ref 13.0–17.0)
Lymphocytes Relative: 20.7 % (ref 12.0–46.0)
Lymphs Abs: 1.4 10*3/uL (ref 0.7–4.0)
MCHC: 35 g/dL (ref 30.0–36.0)
MCV: 91.2 fl (ref 78.0–100.0)
Monocytes Absolute: 0.5 10*3/uL (ref 0.1–1.0)
Monocytes Relative: 6.9 % (ref 3.0–12.0)
Neutro Abs: 4.9 10*3/uL (ref 1.4–7.7)
Neutrophils Relative %: 70.6 % (ref 43.0–77.0)
Platelets: 197 10*3/uL (ref 150.0–400.0)
RBC: 5.01 Mil/uL (ref 4.22–5.81)
RDW: 13.1 % (ref 11.5–15.5)
WBC: 7 10*3/uL (ref 4.0–10.5)

## 2019-05-10 LAB — LIPID PANEL
Cholesterol: 117 mg/dL (ref 0–200)
HDL: 57 mg/dL (ref >39.00)
LDL Cholesterol: 51 mg/dL (ref 0–99)
NonHDL: 59.86
Total CHOL/HDL Ratio: 2
Triglycerides: 46 mg/dL (ref 0.0–149.0)
VLDL: 9.2 mg/dL (ref 0.0–40.0)

## 2019-05-10 NOTE — Patient Instructions (Addendum)
Health Maintenance Due  Topic Date Due  . INFLUENZA VACCINE -today 02/05/2019  . PNA vac Low Risk Adult (1 of 2 - PCV13)-today. Pneumovax 23 next year 04/30/2019   1 year physical as long as continues home blood pressure monitoring and averaging well under 140/90  Please stop by lab before you go If you do not have mychart- we will call you about results within 5 business days of [place] receiving them.  If you have mychart- we will send your results within 3 business days of [place] receiving them.  If abnormal or we want to clarify a result, we will call or mychart you to make sure you receive the message.  If you have questions or concerns or don't hear within 5-7 days, please send [place] a message or call us.

## 2019-05-10 NOTE — Progress Notes (Signed)
Phone: [phone]   Subjective:  Patient presents today for their annual physical. Chief complaint-noted.   See problem oriented charting- ROS- full  review of systems was completed and negative  Including No chest pain or shortness of breath. No headache or blurry vision.   The following were reviewed and entered/updated in epic: Past Medical History:  Diagnosis Date  . Hypertension    Patient Active Problem List   Diagnosis Date Noted  . Hyperglycemia 11/20/2015    Priority: Medium  . Essential hypertension 05/19/2007    Priority: Medium  . Rash and nonspecific skin eruption 07/24/2014    Priority: Low  . Erectile dysfunction 04/20/2014    Priority: Low  . Testicular hypofunction 03/21/2010    Priority: Low   Past Surgical History:  Procedure Laterality Date  . none      Family History  Problem Relation Age of Onset  . Diabetes Mother   . Hypertension Mother   . Heart disease Father        cabg x2, heavy smoker  . Hyperlipidemia Father   . Diabetes Father   . Mental illness Father        dementia    Medications- reviewed and updated Current Outpatient Medications  Medication Sig Dispense Refill  . Multiple Vitamin (MULTIVITAMIN) tablet Take 1 tablet by mouth daily.      . ramipril (ALTACE) 10 MG capsule TAKE 1 CAPSULE BY MOUTH  DAILY 90 capsule 0   No current facility-administered medications for this visit.     Allergies-reviewed and updated Allergies  Allergen Reactions  . Penicillins     REACTION: reaction when 63 years old    Social History   Social History Narrative   Married 2nd wife. 2 kids - healthy. 3 grandkids, last born December 2015. 1 in [place] and 1 in [facility]   [employer] undergraduate.       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       Objective  Objective:  BP 130/82 Comment: home reading- most recent  Pulse 82   Temp 99.4 F (37.4 C)   Ht 5\' 5"  (1.651  m)   Wt 181 lb 9.6 oz (82.4 kg)   SpO2 98%   BMI 30.22 kg/m  Gen: NAD, resting comfortably HEENT: Mask not removed due to covid 19. TM normal. Bridge of nose normal. Eyelids normal.  Neck: no thyromegaly or cervical lymphadenopathy  CV: RRR no murmurs rubs or gallops Lungs: CTAB no crackles, wheeze, rhonchi Abdomen: soft/nontender/nondistended/normal bowel sounds. No rebound or guarding.  Ext: no edema Skin: warm, dry Neuro: grossly normal, moves all extremities, PERRLA   Assessment and Plan  63 y.o. male presenting for annual physical.  Health Maintenance counseling: 1. Anticipatory guidance: Patient counseled regarding regular dental exams -q3 months with prior dental surgery- dentist and oral surgeon, eye exams -yearly,  avoiding smoking and second hand smoke , limiting alcohol to 2 beverages per day .   2. Risk factor reduction:  Advised patient of need for regular exercise and diet rich and fruits and vegetables to reduce risk of heart attack and stroke. Pt states diet and exercise are going well, he is doing a lot of walking. He cooks mostly with occasional take out. Walking daily 4.5 miles.  weight down 1 lb last year.  Wt Readings from Last 3 Encounters:  05/10/19 181 lb 9.6 oz (82.4 kg)  02/08/18 182 lb (82.6  kg)  11/16/17 190 lb 3.2 oz (86.3 kg)  3. Immunizations/screenings/ancillary studies- flu shot and prevnar 13 given today. Pneumovax 23 next year.  Immunization History  Administered Date(s) Administered  . Fluad Quad(high Dose 65+) 05/10/2019  . Influenza Split 04/29/2011, 04/23/2012  . Influenza Whole 03/21/2010  . Influenza,inj,Quad PF,6+ Mos 04/15/2013, 04/20/2014, 05/02/2015, 04/23/2016, 04/09/2017, 04/15/2018  . Pneumococcal Conjugate-13 05/10/2019  . Td 12/28/2007  . Tdap 12/28/2007, 02/08/2018  . Zoster 06/28/2012  . Zoster Recombinat (Shingrix) 11/16/2017, 02/08/2018  4. Prostate cancer screening-   Will trend PSA. Rectal exam low risk in past. No change in  urinary symptoms.  Lab Results  Component Value Date   PSA 0.78 02/08/2018   PSA 0.66 01/30/2017   PSA 0.46 11/13/2015   5. Colon cancer screening - Cologuard 02/23/2018 with 3-year repeat planned 6. Skin cancer screening- dermatology yearly. advised regular sunscreen use. Denies worrisome, changing, or new skin lesions.  7. Never smoker  Status of chronic or acute concerns   Essential hypertension= compliant with ramipril 10 mg. Home BP 120-130/70-80. White coat hypertension but controlled at home. Most recent home reading 130/80- updated this today.  BP Readings from Last 3 Encounters:  05/10/19 130/82-   02/08/18 (!) 146/84  11/16/17 (!) 142/92   Hyperglycemia-CBGs high in the past but A1c has been within normal range- will skip this year and repeat next year potentially depending on blood sugar  Lab Results  Component Value Date   HGBA1C 5.4 02/08/2018   HGBA1C 5.4 12/28/2007   Lipids have been phenomenal- will recheck due to hypertension but doubt would start medicine.   Recommended follow up: 1 year physical as long as continues home blood pressure monitoring and averaging well under 140/90  Lab/Order associations: fasting   ICD-10-CM   1. Preventative health care  Z00.00   2. Essential hypertension  I10   3. Hyperglycemia  R73.9   4. Need for immunization against influenza  Z23 Flu Vaccine QUAD High Dose(Fluad)  5. Need for pneumococcal vaccination  Z23 Pneumococcal conjugate vaccine 13-valent IM    No orders of the defined types were placed in this encounter.   Return precautions advised.  [name], MD

## 2019-05-31 ENCOUNTER — Encounter: Payer: Self-pay | Admitting: Family Medicine

## 2019-06-11 ENCOUNTER — Other Ambulatory Visit: Payer: Self-pay | Admitting: Family Medicine

## 2019-07-12 ENCOUNTER — Encounter: Payer: Self-pay | Admitting: Family Medicine

## 2019-08-02 ENCOUNTER — Ambulatory Visit: Payer: Medicare Other

## 2019-08-11 ENCOUNTER — Ambulatory Visit: Payer: Medicare Other | Attending: Internal Medicine

## 2019-08-11 DIAGNOSIS — Z23 Encounter for immunization: Secondary | ICD-10-CM | POA: Insufficient documentation

## 2019-08-11 NOTE — Progress Notes (Signed)
   Covid-19 Vaccination Clinic  Name:  Farid Grigorian    MRN: [id number] DOB: [birth year]  08/11/2019  Mr. Noon was observed post Covid-19 immunization for 30 minutes based on pre-vaccination screening without incidence. He was provided with Vaccine Information Sheet and instruction to access the V-Safe system.   Mr. Elamin was instructed to call 911 with any severe reactions post vaccine: Marland Kitchen Difficulty breathing  . Swelling of your face and throat  . A fast heartbeat  . A bad rash all over your body  . Dizziness and weakness    Immunizations Administered    Name Date Dose VIS Date Route   Pfizer COVID-19 Vaccine 08/11/2019  1:37 PM 0.3 mL 06/17/2019 Intramuscular   Manufacturer: [employer], [employer]   Lot: [id number]   NDC: [address]-4

## 2019-08-19 ENCOUNTER — Ambulatory Visit: Payer: Medicare Other

## 2019-09-05 ENCOUNTER — Ambulatory Visit: Payer: Medicare Other | Attending: Internal Medicine

## 2019-09-05 DIAGNOSIS — Z23 Encounter for immunization: Secondary | ICD-10-CM

## 2019-09-05 NOTE — Progress Notes (Signed)
   Covid-19 Vaccination Clinic  Name:  Maxton Noreen    MRN: [id number] DOB: Jul 30, 1955  09/05/2019  Mr. Hettich was observed post Covid-19 immunization for 30 minutes based on pre-vaccination screening without incidence. He was provided with Vaccine Information Sheet and instruction to access the V-Safe system.   Mr. Belair was instructed to call 911 with any severe reactions post vaccine: Marland Kitchen Difficulty breathing  . Swelling of your face and throat  . A fast heartbeat  . A bad rash all over your body  . Dizziness and weakness    Immunizations Administered    Name Date Dose VIS Date Route   Pfizer COVID-19 Vaccine 09/05/2019  4:14 PM 0.3 mL 06/17/2019 Intramuscular   Manufacturer: [employer], [employer]   Lot: [id number]   NDC: [address]-9

## 2020-03-26 DIAGNOSIS — L570 Actinic keratosis: Secondary | ICD-10-CM | POA: Diagnosis not present

## 2020-05-03 ENCOUNTER — Other Ambulatory Visit: Payer: Self-pay | Admitting: Family Medicine

## 2020-05-14 ENCOUNTER — Ambulatory Visit (INDEPENDENT_AMBULATORY_CARE_PROVIDER_SITE_OTHER): Payer: Medicare Other

## 2020-05-14 DIAGNOSIS — Z0000 Encounter for general adult medical examination without abnormal findings: Secondary | ICD-10-CM

## 2020-05-14 NOTE — Patient Instructions (Addendum)
Douglas Delgado , Thank you for taking time to come for your Medicare Wellness Visit. I appreciate your ongoing commitment to your health goals. Please review the following plan we discussed and let me know if I can assist you in the future.   Screening recommendations/referrals: Colonoscopy: Done 02/16/18 Recommended yearly ophthalmology/optometry visit for glaucoma screening and checkup Recommended yearly dental visit for hygiene and checkup  Vaccinations: Influenza vaccine: Pt wants to receive Flu vaccine at 05/22/20 appointment Pneumococcal vaccine: 1st dose given aware of 2nd administration Tdap vaccine: Up to date Shingles vaccine: Completed 11/16/17 & 02/08/18   Covid-19: Completed 2/4, 3/1, & 04/02/20  Advanced directives: Please bring a copy of your health care power of attorney and living will to the office at your convenience.  Conditions/risks identified: Stay Healthy  Next appointment: Follow up in one year for your annual wellness visit.   Preventive Care 74 Years and Older, Male Preventive care refers to lifestyle choices and visits with your health care provider that can promote health and wellness. What does preventive care include?  A yearly physical exam. This is also called an annual well check.  Dental exams once or twice a year.  Routine eye exams. Ask your health care provider how often you should have your eyes checked.  Personal lifestyle choices, including:  Daily care of your teeth and gums.  Regular physical activity.  Eating a healthy diet.  Avoiding tobacco and drug use.  Limiting alcohol use.  Practicing safe sex.  Taking low doses of aspirin every day.  Taking vitamin and mineral supplements as recommended by your health care provider. What happens during an annual well check? The services and screenings done by your health care provider during your annual well check will depend on your age, overall health, lifestyle risk factors, and family  history of disease. Counseling  Your health care provider may ask you questions about your:  Alcohol use.  Tobacco use.  Drug use.  Emotional well-being.  Home and relationship well-being.  Sexual activity.  Eating habits.  History of falls.  Memory and ability to understand (cognition).  Work and work Astronomer. Screening  You may have the following tests or measurements:  Height, weight, and BMI.  Blood pressure.  Lipid and cholesterol levels. These may be checked every 5 years, or more frequently if you are over 87 years old.  Skin check.  Lung cancer screening. You may have this screening every year starting at age 66 if you have a 30-pack-year history of smoking and currently smoke or have quit within the past 15 years.  Fecal occult blood test (FOBT) of the stool. You may have this test every year starting at age 62.  Flexible sigmoidoscopy or colonoscopy. You may have a sigmoidoscopy every 5 years or a colonoscopy every 10 years starting at age 76.  Prostate cancer screening. Recommendations will vary depending on your family history and other risks.  Hepatitis C blood test.  Hepatitis B blood test.  Sexually transmitted disease (STD) testing.  Diabetes screening. This is done by checking your blood sugar (glucose) after you have not eaten for a while (fasting). You may have this done every 1-3 years.  Abdominal aortic aneurysm (AAA) screening. You may need this if you are a current or former smoker.  Osteoporosis. You may be screened starting at age 29 if you are at high risk. Talk with your health care provider about your test results, treatment options, and if necessary, the need for more tests.  Vaccines  Your health care provider may recommend certain vaccines, such as:  Influenza vaccine. This is recommended every year.  Tetanus, diphtheria, and acellular pertussis (Tdap, Td) vaccine. You may need a Td booster every 10 years.  Zoster vaccine.  You may need this after age 23.  Pneumococcal 13-valent conjugate (PCV13) vaccine. One dose is recommended after age 66.  Pneumococcal polysaccharide (PPSV23) vaccine. One dose is recommended after age 53. Talk to your health care provider about which screenings and vaccines you need and how often you need them. This information is not intended to replace advice given to you by your health care provider. Make sure you discuss any questions you have with your health care provider. Document Released: 07/20/2015 Document Revised: 03/12/2016 Document Reviewed: 04/24/2015 Elsevier Interactive Patient Education  2017 [employer].  Fall Prevention in the Home Falls can cause injuries. They can happen to people of all ages. There are many things you can do to make your home safe and to help prevent falls. What can I do on the outside of my home?  Regularly fix the edges of walkways and driveways and fix any cracks.  Remove anything that might make you trip as you walk through a door, such as a raised step or threshold.  Trim any bushes or trees on the path to your home.  Use bright outdoor lighting.  Clear any walking paths of anything that might make someone trip, such as rocks or tools.  Regularly check to see if handrails are loose or broken. Make sure that both sides of any steps have handrails.  Any raised decks and porches should have guardrails on the edges.  Have any leaves, snow, or ice cleared regularly.  Use sand or salt on walking paths during winter.  Clean up any spills in your garage right away. This includes oil or grease spills. What can I do in the bathroom?  Use night lights.  Install grab bars by the toilet and in the tub and shower. Do not use towel bars as grab bars.  Use non-skid mats or decals in the tub or shower.  If you need to sit down in the shower, use a plastic, non-slip stool.  Keep the floor dry. Clean up any water that spills on the floor as soon  as it happens.  Remove soap buildup in the tub or shower regularly.  Attach bath mats securely with double-sided non-slip rug tape.  Do not have throw rugs and other things on the floor that can make you trip. What can I do in the bedroom?  Use night lights.  Make sure that you have a light by your bed that is easy to reach.  Do not use any sheets or blankets that are too big for your bed. They should not hang down onto the floor.  Have a firm chair that has side arms. You can use this for support while you get dressed.  Do not have throw rugs and other things on the floor that can make you trip. What can I do in the kitchen?  Clean up any spills right away.  Avoid walking on wet floors.  Keep items that you use a lot in easy-to-reach places.  If you need to reach something above you, use a strong step stool that has a grab bar.  Keep electrical cords out of the way.  Do not use floor polish or wax that makes floors slippery. If you must use wax, use non-skid floor wax.  Do not have throw rugs and other things on the floor that can make you trip. What can I do with my stairs?  Do not leave any items on the stairs.  Make sure that there are handrails on both sides of the stairs and use them. Fix handrails that are broken or loose. Make sure that handrails are as long as the stairways.  Check any carpeting to make sure that it is firmly attached to the stairs. Fix any carpet that is loose or worn.  Avoid having throw rugs at the top or bottom of the stairs. If you do have throw rugs, attach them to the floor with carpet tape.  Make sure that you have a light switch at the top of the stairs and the bottom of the stairs. If you do not have them, ask someone to add them for you. What else can I do to help prevent falls?  Wear shoes that:  Do not have high heels.  Have rubber bottoms.  Are comfortable and fit you well.  Are closed at the toe. Do not wear sandals.  If  you use a stepladder:  Make sure that it is fully opened. Do not climb a closed stepladder.  Make sure that both sides of the stepladder are locked into place.  Ask someone to hold it for you, if possible.  Clearly Ehab and make sure that you can see:  Any grab bars or handrails.  First and last steps.  Where the edge of each step is.  Use tools that help you move around (mobility aids) if they are needed. These include:  Canes.  Walkers.  Scooters.  Crutches.  Turn on the lights when you go into a dark area. Replace any light bulbs as soon as they burn out.  Set up your furniture so you have a clear path. Avoid moving your furniture around.  If any of your floors are uneven, fix them.  If there are any pets around you, be aware of where they are.  Review your medicines with your doctor. Some medicines can make you feel dizzy. This can increase your chance of falling. Ask your doctor what other things that you can do to help prevent falls. This information is not intended to replace advice given to you by your health care provider. Make sure you discuss any questions you have with your health care provider. Document Released: 04/19/2009 Document Revised: 11/29/2015 Document Reviewed: 07/28/2014 Elsevier Interactive Patient Education  2017 [employer].

## 2020-05-14 NOTE — Progress Notes (Signed)
Virtual Visit via Telephone Note  I connected with  Douglas Delgado on 05/14/20 at 11:00 AM EST by telephone and verified that I am speaking with the correct person using two identifiers.  Medicare Annual Wellness visit completed telephonically due to Covid-19 pandemic.   Persons participating in this call: This Health Coach and this patient.   Location: Patient: Home Provider: Office   I discussed the limitations, risks, security and privacy concerns of performing an evaluation and management service by telephone and the availability of in person appointments. The patient expressed understanding and agreed to proceed.  Unable to perform video visit due to video visit attempted and failed and/or patient does not have video capability.   Some vital signs may be absent or patient reported.   Marzella Schlein, LPN    Subjective:   Douglas Delgado is a 64 y.o. male who presents for an Initial Medicare Annual Wellness Visit.  Review of Systems     Cardiac Risk Factors include: advanced age (>34men, >64 women);male gender;hypertension;obesity (BMI >30kg/m2)     Objective:    There were no vitals filed for this visit. There is no height or weight on file to calculate BMI.  Advanced Directives 05/14/2020  Does Patient Have a Medical Advance Directive? Yes  Type of Estate agent of [place];Living will  Copy of Healthcare Power of Attorney in Chart? No - copy requested    Current Medications (verified) Outpatient Encounter Medications as of 05/14/2020  Medication Sig  . Multiple Vitamin (MULTIVITAMIN) tablet Take 1 tablet by mouth daily.    . ramipril (ALTACE) 10 MG capsule TAKE 1 CAPSULE BY MOUTH  DAILY   No facility-administered encounter medications on file as of 05/14/2020.    Allergies (verified) Penicillins   History: Past Medical History:  Diagnosis Date  . Hypertension    Past Surgical History:  Procedure Laterality Date  . none     Family  History  Problem Relation Age of Onset  . Diabetes Mother   . Hypertension Mother   . Heart disease Father        cabg x2, heavy smoker  . Hyperlipidemia Father   . Diabetes Father   . Mental illness Father        dementia   Social History   Socioeconomic History  . Marital status: Married    Spouse name: Not on file  . Number of children: Not on file  . Years of education: Not on file  . Highest education level: Not on file  Occupational History  . Occupation: Journalist, newspaper: [employer]    Comment: retired  Tobacco Use  . Smoking status: Never Smoker  . Smokeless tobacco: Never Used  Substance and Sexual Activity  . Alcohol use: Yes    Comment: 6 beers a year  . Drug use: No  . Sexual activity: Not on file  Other Topics Concern  . Not on file  Social History Narrative   Married 2nd wife. 2 kids - healthy. 3 grandkids, last born December 2015. 1 in [place] and 1 in [facility]   [place] undergraduate.       [employer]: ride bike on [place], sports-[employer], [place] , grandkids, reading.       Social Determinants of Health   Financial Resource Strain: Low Risk   . Difficulty of Paying Living Expenses: Not hard at all  Food Insecurity: No Food Insecurity  .  Worried About Programme researcher, broadcasting/film/videounning Out of Food in the Last Year: Never true  . Ran Out of Food in the Last Year: Never true  Transportation Needs: No Transportation Needs  . Lack of Transportation (Medical): No  . Lack of Transportation (Non-Medical): No  Physical Activity: Sufficiently Active  . Days of Exercise per Week: 7 days  . Minutes of Exercise per Session: 90 min  Stress: No Stress Concern Present  . Feeling of Stress : Not at all  Social Connections: Moderately Integrated  . Frequency of Communication with Friends and Family: More than three times a week  . Frequency of Social Gatherings with Friends and Family: Twice a week  . Attends Religious Services: 1  to 4 times per year  . Active Member of Clubs or Organizations: No  . Attends BankerClub or Organization Meetings: Never  . Marital Status: Married    Tobacco Counseling Counseling given: Not Answered   Clinical Intake:  Pre-visit preparation completed: Yes  Pain : No/denies pain     BMI - recorded: 30.22 Nutritional Status: BMI > 30  Obese Diabetes: No  How often do you need to have someone help you when you read instructions, pamphlets, or other written materials from your doctor or pharmacy?: 1 - Never  Diabetic?No  Interpreter Needed?: No  Information entered by :: Douglas Ensignina Rue Valladares, LPN   Activities of Daily Living In your present state of health, do you have any difficulty performing the following activities: 05/14/2020  Hearing? N  Vision? N  Difficulty concentrating or making decisions? N  Walking or climbing stairs? N  Dressing or bathing? N  Doing errands, shopping? N  Preparing Food and eating ? N  Using the Toilet? N  In the past six months, have you accidently leaked urine? N  Do you have problems with loss of bowel control? N  Managing your Medications? N  Managing your Finances? N  Housekeeping or managing your Housekeeping? N  Some recent data might be hidden    Patient Care Team: Shelva MajesticHunter, Stephen O, MD as PCP - General (Family Medicine)  Indicate any recent Medical Services you may have received from other than [facility] providers in the past year (date may be approximate).     Assessment:   This is a routine wellness examination for Douglas Delgado.  Hearing/Vision screen  Hearing Screening   125Hz  250Hz  500Hz  1000Hz  2000Hz  3000Hz  4000Hz  6000Hz  8000Hz   Right ear:           Left ear:           Comments: Pt denies any hearing issues   Vision Screening Comments: Pt follows up with Blima LedgerSally Miller for annual eye exams  Dietary issues and exercise activities discussed: Current Exercise Habits: Home exercise routine, Type of exercise: walking, Time (Minutes): > 60,  Frequency (Times/Week): 7, Weekly Exercise (Minutes/Week): 0  Goals    . Patient Stated     Stay Healthy      Depression Screen PHQ 2/9 Scores 05/14/2020 05/10/2019 02/08/2018 11/20/2015  PHQ - 2 Score 0 0 0 0    Fall Risk Fall Risk  05/14/2020 02/08/2018 11/20/2015  Falls in the past year? 0 No No  Number falls in past yr: 0 - -  Injury with Fall? 0 - -  Risk for fall due to : Impaired vision - -  Follow up Falls prevention discussed - -    Any stairs in or around the home? Yes  If so, are there any without handrails? No  Home free of loose throw rugs in walkways, pet beds, electrical cords, etc? Yes  Adequate lighting in your home to reduce risk of falls? Yes   ASSISTIVE DEVICES UTILIZED TO PREVENT FALLS:  Life alert? No  Use of a cane, walker or w/c? No  Grab bars in the bathroom? No  Shower chair or bench in shower? No  Elevated toilet seat or a handicapped toilet? No   TIMED UP AND GO:  Was the test performed? No .     Cognitive Function:     6CIT Screen 05/14/2020  What Year? 0 points  What month? 0 points  Count back from 20 0 points  Months in reverse 0 points  Repeat phrase 0 points    Immunizations Immunization History  Administered Date(s) Administered  . Fluad Quad(high Dose 65+) 05/10/2019  . Influenza Split 04/29/2011, 04/23/2012  . Influenza Whole 03/21/2010  . Influenza,inj,Quad PF,6+ Mos 04/15/2013, 04/20/2014, 05/02/2015, 04/23/2016, 04/09/2017, 04/15/2018  . PFIZER SARS-COV-2 Vaccination 08/11/2019, 09/05/2019, 04/02/2020  . Pneumococcal Conjugate-13 05/10/2019  . Td 12/28/2007  . Tdap 12/28/2007, 02/08/2018  . Zoster 06/28/2012  . Zoster Recombinat (Shingrix) 11/16/2017, 02/08/2018    TDAP status: Up to date   Flu Vaccine status: Declined, Education has been provided regarding the importance of this vaccine but patient still declined. Advised may receive this vaccine at local pharmacy or Health Dept. Aware to provide a copy of the  vaccination record if obtained from local pharmacy or Health Dept. Verbalized acceptance and understanding. Pt wants flu vaccine 05/22/20 appointment  Pneumococcal vaccine status: Declined,  Education has been provided regarding the importance of this vaccine but patient still declined. Advised may receive this vaccine at local pharmacy or Health Dept. Aware to provide a copy of the vaccination record if obtained from local pharmacy or Health Dept. Verbalized acceptance and understanding.    Covid-19 vaccine status: Completed vaccines  Qualifies for Shingles Vaccine? Yes   Zostavax completed Yes   Shingrix Completed?: Yes  Screening Tests Health Maintenance  Topic Date Due  . PNA vac Low Risk Adult (2 of 2 - PPSV23) 05/09/2020  . INFLUENZA VACCINE  05/22/2020 (Originally 02/05/2020)  . Fecal DNA (Cologuard)  02/16/2021  . TETANUS/TDAP  02/09/2028  . COVID-19 Vaccine  Completed  . Hepatitis C Screening  Completed    Health Maintenance  Health Maintenance Due  Topic Date Due  . PNA vac Low Risk Adult (2 of 2 - PPSV23) 05/09/2020    Colorectal cancer screening: Completed 02/16/18. Repeat every 3 years    Additional Screening:  Hepatitis C Screening: Completed 11/13/15  Vision Screening: Recommended annual ophthalmology exams for early detection of glaucoma and other disorders of the eye. Is the patient up to date with their annual eye exam?  Yes  Who is the provider or what is the name of the office in which the patient attends annual eye exams? Dr Blima Ledger   Dental Screening: Recommended annual dental exams for proper oral hygiene  Community Resource Referral / Chronic Care Management: CRR required this visit?  No   CCM required this visit?  No      Plan:     I have personally reviewed and noted the following in the patient's chart:   . Medical and social history . Use of alcohol, tobacco or illicit drugs  . Current medications and supplements . Functional  ability and status . Nutritional status . Physical activity . Advanced directives . List of other physicians . Hospitalizations, surgeries, and  ER visits in previous 12 months . Vitals . Screenings to include cognitive, depression, and falls . Referrals and appointments  In addition, I have reviewed and discussed with patient certain preventive protocols, quality metrics, and best practice recommendations. A written personalized care plan for preventive services as well as general preventive health recommendations were provided to patient.     Marzella Schlein, LPN   14/03/7025   Nurse Notes: Pt wants to receive Flu Vaccine at Upcoming 05/22/20 appt with Dr Durene Cal.

## 2020-05-21 NOTE — Patient Instructions (Addendum)
Please stop by lab before you go If you have mychart- we will send your results within 3 business days of [place] receiving them.  If you do not have mychart- we will call you about results within 5 business days of [place] receiving them.  *please note we are currently using [facility] which has a longer processing time than [facility] typically so labs may not come back as quickly as in the past *please also note that you will see labs on mychart as soon as they post. I will later go in and write notes on them- will say "notes from Dr. Durene Cal"  Health Maintenance Due  Topic Date Due  . PNA vac Low Risk Adult (2 of 2 - PPSV23) Would like to discuss before getting.   High dose flu shot today 05/09/2020

## 2020-05-21 NOTE — Progress Notes (Signed)
Phone: [phone]   Subjective:  Patient presents today for their annual physical. Chief complaint-noted.   See problem oriented charting- ROS- full  review of systems was completed and negative  except for: seasonal allergies  The following were reviewed and entered/updated in epic: Past Medical History:  Diagnosis Date  . Hypertension    Patient Active Problem List   Diagnosis Date Noted  . Hyperglycemia 11/20/2015    Priority: Medium  . Essential hypertension 05/19/2007    Priority: Medium  . Rash and nonspecific skin eruption 07/24/2014    Priority: Low  . Erectile dysfunction 04/20/2014    Priority: Low  . Testicular hypofunction 03/21/2010    Priority: Low   Past Surgical History:  Procedure Laterality Date  . none      Family History  Problem Relation Age of Onset  . Diabetes Mother   . Hypertension Mother   . Heart disease Father        cabg x2, heavy smoker  . Hyperlipidemia Father   . Diabetes Father   . Mental illness Father        dementia    Medications- reviewed and updated Current Outpatient Medications  Medication Sig Dispense Refill  . Multiple Vitamin (MULTIVITAMIN) tablet Take 1 tablet by mouth daily.      . ramipril (ALTACE) 10 MG capsule TAKE 1 CAPSULE BY MOUTH  DAILY 90 capsule 3   No current facility-administered medications for this visit.    Allergies-reviewed and updated Allergies  Allergen Reactions  . Penicillins     REACTION: reaction when 64 years old    Social History   Social History Narrative   Married 2nd wife. 2 kids - healthy. 3 grandkids, last born December 2015. 1 in [place] and 1 in [facility]   [employer] undergraduate.       Retired 2019-    [employer]      Hobbies: ride bike on [place], sports-[employer], [place] , grandkids, reading.       Objective  Objective:  BP 132/80   Pulse 82   Temp 99.4 F (37.4 C) (Temporal)   Resp 18   Ht 5\' 5"  (1.651 m)   Wt 177 lb 6.4 oz (80.5 kg)   SpO2  99%   BMI 29.52 kg/m  Gen: NAD, resting comfortably HEENT: Mucous membranes are moist. Oropharynx normal Neck: no thyromegaly CV: RRR no murmurs rubs or gallops Lungs: CTAB no crackles, wheeze, rhonchi Abdomen: soft/nontender/nondistended/normal bowel sounds. No rebound or guarding.  Ext: no edema Skin: warm, dry Neuro: grossly normal, moves all extremities, PERRLA    Assessment and Plan  64 y.o. male presenting for annual physical.  Health Maintenance counseling: 1. Anticipatory guidance: Patient counseled regarding regular dental exams -q3 months with prior dental surgery- dentist and oral surgeon, eye exams -yearly,  avoiding smoking and second hand smoke , limiting alcohol to 2 beverages per day - or less.   2. Risk factor reduction:  Advised patient of need for regular exercise and diet rich and fruits and vegetables to reduce risk of heart attack and stroke. Exercise- 4.5 miles walking daily weather permitting. Diet- Down 4 pounds from last year- thinks loss is mainly with walking and probably eating out less Wt Readings from Last 3 Encounters:  05/22/20 177 lb 6.4 oz (80.5 kg)  05/10/19 181 lb 9.6 oz (82.4 kg)  02/08/18 182 lb (82.6 kg)  3. Immunizations/screenings/ancillary studies-Pneumovax 23 plan today along with high dose flu shot  Immunization History  Administered Date(s) Administered  . Fluad Quad(high Dose 65+) 05/10/2019  . Influenza Split 04/29/2011, 04/23/2012  . Influenza Whole 03/21/2010  . Influenza,inj,Quad PF,6+ Mos 04/15/2013, 04/20/2014, 05/02/2015, 04/23/2016, 04/09/2017, 04/15/2018  . PFIZER SARS-COV-2 Vaccination 08/11/2019, 09/05/2019, 04/02/2020  . Pneumococcal Conjugate-13 05/10/2019  . Td 12/28/2007  . Tdap 12/28/2007, 02/08/2018  . Zoster 06/28/2012  . Zoster Recombinat (Shingrix) 11/16/2017, 02/08/2018  4. Prostate cancer screening- we will trend PSA-low risk prior trending. Rectal exam low risk in the past and no change in urinary symptoms Lab  Results  Component Value Date   PSA 0.54 05/10/2019   PSA 0.78 02/08/2018   PSA 0.66 01/30/2017   5. Colon cancer screening - Cologuard August 2019 with 3-year repeat planned-likely order next physical 6. Skin cancer screening-dermatology yearly- had precancer removed from left ear after last years visit. advised regular sunscreen use. Denies worrisome, changing, or new skin lesions.  7. Never smoker 8. STD screening - monogamous so declines  Status of chronic or acute concerns   #hypertension- usually with white coat element but better today S: medication: Ramipril 10 mg  Home readings #s: 130s/80s at home BP Readings from Last 3 Encounters:  05/22/20 132/80  05/10/19 130/82  02/08/18 (!) 146/84  A/P: Stable. Continue current medications.   # Hyperglycemia/insulin resistance/prediabetes-blood sugars have been high but prior A1c's have been normal S:  Medication: None Exercise and diet- down 4 pounds from last year Lab Results  Component Value Date   HGBA1C 5.4 02/08/2018   HGBA1C 5.4 12/28/2007   A/P: Suspect fasting sugar may have improved with weight loss. Update CBG as well as A1c.  #Screen for hyperlipidemia-lipids have been excellent in the past and we will recheck every 3-5 years Lab Results  Component Value Date   CHOL 117 05/10/2019   HDL 57.00 05/10/2019   LDLCALC 51 05/10/2019   TRIG 46.0 05/10/2019   CHOLHDL 2 05/10/2019    Recommended follow up: Return in about 1 year (around 05/22/2021) for physical or sooner if needed. Future Appointments  Date Time Provider Department Center  05/27/2021 11:00 AM [facility]   Lab/Order associations: fasting   ICD-10-CM   1. Preventative health care  Z00.00 CBC With Differential/Platelet    COMPLETE METABOLIC PANEL WITH GFR    PSA    Hemoglobin A1c  2. Essential hypertension  I10 CBC With Differential/Platelet    COMPLETE METABOLIC PANEL WITH GFR  3. Hyperglycemia  R73.9 Hemoglobin A1c  4.  Screening for prostate cancer  Z12.5 PSA  5. Screening for hyperlipidemia  Z13.220    Return precautions advised.  Tana Conch, MD

## 2020-05-22 ENCOUNTER — Encounter: Payer: Self-pay | Admitting: Family Medicine

## 2020-05-22 ENCOUNTER — Other Ambulatory Visit: Payer: Self-pay

## 2020-05-22 ENCOUNTER — Ambulatory Visit (INDEPENDENT_AMBULATORY_CARE_PROVIDER_SITE_OTHER): Payer: Medicare Other | Admitting: Family Medicine

## 2020-05-22 VITALS — BP 132/80 | HR 82 | Temp 99.4°F | Resp 18 | Ht 65.0 in | Wt 177.4 lb

## 2020-05-22 DIAGNOSIS — R739 Hyperglycemia, unspecified: Secondary | ICD-10-CM | POA: Diagnosis not present

## 2020-05-22 DIAGNOSIS — I10 Essential (primary) hypertension: Secondary | ICD-10-CM

## 2020-05-22 DIAGNOSIS — Z0000 Encounter for general adult medical examination without abnormal findings: Secondary | ICD-10-CM | POA: Diagnosis not present

## 2020-05-22 DIAGNOSIS — Z125 Encounter for screening for malignant neoplasm of prostate: Secondary | ICD-10-CM

## 2020-05-22 DIAGNOSIS — Z13220 Encounter for screening for lipoid disorders: Secondary | ICD-10-CM

## 2020-05-22 DIAGNOSIS — Z23 Encounter for immunization: Secondary | ICD-10-CM | POA: Diagnosis not present

## 2020-05-22 NOTE — Addendum Note (Signed)
Addended by: Manuela Schwartz on: 05/22/2020 10:01 AM   Modules accepted: Orders

## 2020-05-23 LAB — COMPLETE METABOLIC PANEL WITH GFR
AG Ratio: 1.7 (calc) (ref 1.0–2.5)
ALT: 20 U/L (ref 9–46)
AST: 19 U/L (ref 10–35)
Albumin: 4.5 g/dL (ref 3.6–5.1)
Alkaline phosphatase (APISO): 68 U/L (ref 35–144)
BUN: 14 mg/dL (ref 7–25)
CO2: 25 mmol/L (ref 20–32)
Calcium: 9.8 mg/dL (ref 8.6–10.3)
Chloride: 105 mmol/L (ref 98–110)
Creat: 1.02 mg/dL (ref 0.70–1.25)
GFR, Est African American: 88 mL/min/{1.73_m2} (ref >=60)
GFR, Est Non African American: 76 mL/min/{1.73_m2} (ref >=60)
Globulin: 2.6 g/dL (calc) (ref 1.9–3.7)
Glucose, Bld: 115 mg/dL — ABNORMAL HIGH (ref 65–99)
Potassium: 5 mmol/L (ref 3.5–5.3)
Sodium: 139 mmol/L (ref 135–146)
Total Bilirubin: 0.7 mg/dL (ref 0.2–1.2)
Total Protein: 7.1 g/dL (ref 6.1–8.1)

## 2020-05-23 LAB — CBC WITH DIFFERENTIAL/PLATELET
Absolute Monocytes: 418 cells/uL (ref 200–950)
Basophils Absolute: 39 cells/uL (ref 0–200)
Basophils Relative: 0.7 %
Eosinophils Absolute: 110 cells/uL (ref 15–500)
Eosinophils Relative: 2 %
HCT: 46.9 % (ref 38.5–50.0)
Hemoglobin: 16.1 g/dL (ref 13.2–17.1)
Lymphs Abs: 1359 cells/uL (ref 850–3900)
MCH: 30.8 pg (ref 27.0–33.0)
MCHC: 34.3 g/dL (ref 32.0–36.0)
MCV: 89.8 fL (ref 80.0–100.0)
MPV: 9.9 fL (ref 7.5–12.5)
Monocytes Relative: 7.6 %
Neutro Abs: 3575 cells/uL (ref 1500–7800)
Neutrophils Relative %: 65 %
Platelets: 204 10*3/uL (ref 140–400)
RBC: 5.22 10*6/uL (ref 4.20–5.80)
RDW: 12.5 % (ref 11.0–15.0)
Total Lymphocyte: 24.7 %
WBC: 5.5 10*3/uL (ref 3.8–10.8)

## 2020-05-23 LAB — HEMOGLOBIN A1C
Hgb A1c MFr Bld: 5.2 % of total Hgb (ref <5.7)
Mean Plasma Glucose: 103 (calc)
eAG (mmol/L): 5.7 (calc)

## 2020-05-23 LAB — PSA: PSA: 0.84 ng/mL (ref <=4.0)

## 2021-03-08 ENCOUNTER — Encounter: Payer: Self-pay | Admitting: Family Medicine

## 2021-04-05 ENCOUNTER — Other Ambulatory Visit: Payer: Self-pay | Admitting: Family Medicine

## 2021-04-09 ENCOUNTER — Encounter: Payer: Self-pay | Admitting: Family Medicine

## 2021-04-10 ENCOUNTER — Other Ambulatory Visit: Payer: Self-pay

## 2021-04-10 DIAGNOSIS — Z1211 Encounter for screening for malignant neoplasm of colon: Secondary | ICD-10-CM

## 2021-04-17 DIAGNOSIS — Z1211 Encounter for screening for malignant neoplasm of colon: Secondary | ICD-10-CM | POA: Diagnosis not present

## 2021-04-24 LAB — COLOGUARD: Cologuard: NEGATIVE

## 2021-05-27 ENCOUNTER — Other Ambulatory Visit: Payer: Self-pay

## 2021-05-27 ENCOUNTER — Ambulatory Visit (INDEPENDENT_AMBULATORY_CARE_PROVIDER_SITE_OTHER): Payer: Medicare Other

## 2021-05-27 DIAGNOSIS — Z0000 Encounter for general adult medical examination without abnormal findings: Secondary | ICD-10-CM

## 2021-05-27 NOTE — Progress Notes (Signed)
Virtual Visit via Telephone Note  I connected with  Douglas Delgado on 05/27/21 at 11:00 AM EST by telephone and verified that I am speaking with the correct person using two identifiers.  Medicare Annual Wellness visit completed telephonically due to Covid-19 pandemic.   Persons participating in this call: This Health Coach and this patient.   Location: Patient: Home Provider: Office   I discussed the limitations, risks, security and privacy concerns of performing an evaluation and management service by telephone and the availability of in person appointments. The patient expressed understanding and agreed to proceed.  Unable to perform video visit due to video visit attempted and failed and/or patient does not have video capability.   Some vital signs may be absent or patient reported.   Marzella Schlein, LPN   Subjective:   Douglas Delgado is a 65 y.o. male who presents for Medicare Annual/Subsequent preventive examination.  Review of Systems     Cardiac Risk Factors include: advanced age (>53men, >83 women);hypertension;male gender     Objective:    There were no vitals filed for this visit. There is no height or weight on file to calculate BMI.  Advanced Directives 05/27/2021 05/14/2020  Does Patient Have a Medical Advance Directive? Yes Yes  Type of Advance Directive Living will Healthcare Power of [place];Living will  Copy of Healthcare Power of Attorney in Chart? - No - copy requested    Current Medications (verified) Outpatient Encounter Medications as of 05/27/2021  Medication Sig   Multiple Vitamin (MULTIVITAMIN) tablet Take 1 tablet by mouth daily.     ramipril (ALTACE) 10 MG capsule TAKE 1 CAPSULE BY MOUTH  DAILY   No facility-administered encounter medications on file as of 05/27/2021.    Allergies (verified) Penicillins   History: Past Medical History:  Diagnosis Date   Hypertension    Past Surgical History:  Procedure Laterality Date   none      Family History  Problem Relation Age of Onset   Diabetes Mother    Hypertension Mother    Heart disease Father        cabg x2, heavy smoker   Hyperlipidemia Father    Diabetes Father    Mental illness Father        dementia   Social History   Socioeconomic History   Marital status: Married    Spouse name: Not on file   Number of children: Not on file   Years of education: Not on file   Highest education level: Not on file  Occupational History   Occupation: CFO Research scientist (medical): [employer]    Comment: retired  Tobacco Use   Smoking status: Never   Smokeless tobacco: Never  Substance and Sexual Activity   Alcohol use: Yes    Comment: 6 beers a year   Drug use: No   Sexual activity: Not on file  Other Topics Concern   Not on file  Social History Narrative   Married 2nd wife. 2 kids - healthy. 3 grandkids, last born December 2015. 1 in [place] and 1 in [facility]   [employer] undergraduate.       Retired 2019-    CFO [employer]      Hobbies: ride bike on [place], sports-[employer], [place] , grandkids, reading.       Social Determinants of Health   Financial Resource Strain: Low Risk    Difficulty of Paying Living Expenses: Not hard at all  Food Insecurity:  No Food Insecurity   Worried About Programme researcher, broadcasting/film/video in the Last Year: Never true   Ran Out of Food in the Last Year: Never true  Transportation Needs: No Transportation Needs   Lack of Transportation (Medical): No   Lack of Transportation (Non-Medical): No  Physical Activity: Sufficiently Active   Days of Exercise per Week: 7 days   Minutes of Exercise per Session: 100 min  Stress: No Stress Concern Present   Feeling of Stress : Not at all  Social Connections: Moderately Isolated   Frequency of Communication with Friends and Family: More than three times a week   Frequency of Social Gatherings with Friends and Family: More than three times a week   Attends Religious Services:  Never   Database administrator or Organizations: No   Attends Engineer, structural: Never   Marital Status: Married    Tobacco Counseling Counseling given: Not Answered   Clinical Intake:  Pre-visit preparation completed: Yes  Pain : No/denies pain     BMI - recorded: 29.52 Nutritional Status: BMI 25 -29 Overweight Nutritional Risks: None Diabetes: No  How often do you need to have someone help you when you read instructions, pamphlets, or other written materials from your doctor or pharmacy?: 1 - Never  Diabetic?No  Interpreter Needed?: No  Information entered by :: Lanier Ensign, LPN   Activities of Daily Living In your present state of health, do you have any difficulty performing the following activities: 05/27/2021  Hearing? N  Vision? N  Difficulty concentrating or making decisions? N  Walking or climbing stairs? N  Dressing or bathing? N  Doing errands, shopping? N  Preparing Food and eating ? N  Using the Toilet? N  In the past six months, have you accidently leaked urine? N  Do you have problems with loss of bowel control? N  Managing your Medications? N  Managing your Finances? N  Housekeeping or managing your Housekeeping? N  Some recent data might be hidden    Patient Care Team: Shelva Majestic, MD as PCP - General (Family Medicine)  Indicate any recent Medical Services you may have received from other than [facility] providers in the past year (date may be approximate).     Assessment:   This is a routine wellness examination for [name].  Hearing/Vision screen Hearing Screening - Comments:: Pt denies any hearing issues  Vision Screening - Comments:: Pt follows up with [facility] for annual eye exams   Dietary issues and exercise activities discussed: Current Exercise Habits: Home exercise routine, Type of exercise: walking, Time (Minutes): > 60, Frequency (Times/Week): 7, Weekly Exercise (Minutes/Week): 0   Goals Addressed              This Visit's Progress    Patient Stated       Stay active and maintain weight        Depression Screen PHQ 2/9 Scores 05/27/2021 05/14/2020 05/10/2019 02/08/2018 11/20/2015  PHQ - 2 Score 0 0 0 0 0    Fall Risk Fall Risk  05/27/2021 05/14/2020 02/08/2018 11/20/2015  Falls in the past year? 0 0 No No  Number falls in past yr: 0 0 - -  Injury with Fall? 0 0 - -  Risk for fall due to : Impaired vision Impaired vision - -  Follow up Falls prevention discussed Falls prevention discussed - -    FALL RISK PREVENTION PERTAINING TO THE HOME:  Any stairs in or around the  home? Yes  If so, are there any without handrails? No  Home free of loose throw rugs in walkways, pet beds, electrical cords, etc? Yes  Adequate lighting in your home to reduce risk of falls? Yes   ASSISTIVE DEVICES UTILIZED TO PREVENT FALLS:  Life alert? Apple watch alert Use of a cane, walker or w/c? No  Grab bars in the bathroom? No  Shower chair or bench in shower? Yes  Elevated toilet seat or a handicapped toilet? No   TIMED UP AND GO:  Was the test performed? No .    Cognitive Function:     6CIT Screen 05/27/2021 05/14/2020  What Year? 0 points 0 points  What month? 0 points 0 points  What time? 0 points -  Count back from 20 0 points 0 points  Months in reverse 0 points 0 points  Repeat phrase 0 points 0 points  Total Score 0 -    Immunizations Immunization History  Administered Date(s) Administered   Fluad Quad(high Dose 65+) 05/10/2019, 05/22/2020   Influenza Split 04/29/2011, 04/23/2012   Influenza Whole 03/21/2010   Influenza, High Dose Seasonal PF 03/13/2021   Influenza,inj,Quad PF,6+ Mos 04/15/2013, 04/20/2014, 05/02/2015, 04/23/2016, 04/09/2017, 04/15/2018   PFIZER(Purple Top)SARS-COV-2 Vaccination 08/11/2019, 09/05/2019, 04/02/2020, 03/13/2021   Pneumococcal Conjugate-13 05/10/2019   Pneumococcal Polysaccharide-23 05/22/2020   Td 12/28/2007   Tdap 12/28/2007, 02/08/2018   Zoster  Recombinat (Shingrix) 11/16/2017, 02/08/2018   Zoster, Live 06/28/2012    TDAP status: Up to date  Flu Vaccine status: Up to date  Pneumococcal vaccine status: Up to date  Covid-19 vaccine status: Completed vaccines  Qualifies for Shingles Vaccine? Yes   Zostavax completed Yes   Shingrix Completed?: Yes  Screening Tests Health Maintenance  Topic Date Due   COVID-19 Vaccine (5 - Booster for Pfizer series) 05/08/2021   Fecal DNA (Cologuard)  04/17/2024   TETANUS/TDAP  02/09/2028   Pneumonia Vaccine 29+ Years old  Completed   INFLUENZA VACCINE  Completed   Hepatitis C Screening  Completed   Zoster Vaccines- Shingrix  Completed   HPV VACCINES  Aged Out    Health Maintenance  Health Maintenance Due  Topic Date Due   COVID-19 Vaccine (5 - Booster for Pfizer series) 05/08/2021    Colorectal cancer screening: Type of screening: Cologuard. Completed 04/17/21. Repeat every 3 years   Additional Screening:  Hepatitis C Screening:  Completed 11/13/15  Vision Screening: Recommended annual ophthalmology exams for early detection of glaucoma and other disorders of the eye. Is the patient up to date with their annual eye exam?  Yes  Who is the provider or what is the name of the office in which the patient attends annual eye exams? [facility] If pt is not established with a provider, would they like to be referred to a provider to establish care? No .   Dental Screening: Recommended annual dental exams for proper oral hygiene  Community Resource Referral / Chronic Care Management: CRR required this visit?  No   CCM required this visit?  No      Plan:     I have personally reviewed and noted the following in the patient's chart:   Medical and social history Use of alcohol, tobacco or illicit drugs  Current medications and supplements including opioid prescriptions. Patient is not currently taking opioid prescriptions. Functional ability and status Nutritional  status Physical activity Advanced directives List of other physicians Hospitalizations, surgeries, and ER visits in previous 12 months Vitals Screenings to include cognitive,  depression, and falls Referrals and appointments  In addition, I have reviewed and discussed with patient certain preventive protocols, quality metrics, and best practice recommendations. A written personalized care plan for preventive services as well as general preventive health recommendations were provided to patient.     Marzella Schlein, LPN   [id number]   Nurse Notes: None

## 2021-05-27 NOTE — Patient Instructions (Addendum)
Douglas Delgado , Thank you for taking time to come for your Medicare Wellness Visit. I appreciate your ongoing commitment to your health goals. Please review the following plan we discussed and let me know if I can assist you in the future.   Screening recommendations/referrals: Colonoscopy: Cologuard 10/812/22 repeat every 3 years  Recommended yearly ophthalmology/optometry visit for glaucoma screening and checkup Recommended yearly dental visit for hygiene and checkup  Vaccinations: Influenza vaccine: Done 03/13/21 repeat every year  Pneumococcal vaccine: Up to date Tdap vaccine: Done 02/08/18 repeat every 10 years  Shingles vaccine: Done 5/13 & 02/08/18   Covid-19: Done 2/4, 3/1, 04/02/20 & 03/13/21  Advanced directives: Please bring a copy of your health care power of attorney and living will to the office at your convenience.  Conditions/risks identified: continue staying active and maintain weight   Next appointment: Follow up in one year for your annual wellness visit.   Preventive Care 31 Years and Older, Male Preventive care refers to lifestyle choices and visits with your health care provider that can promote health and wellness. What does preventive care include? A yearly physical exam. This is also called an annual well check. Dental exams once or twice a year. Routine eye exams. Ask your health care provider how often you should have your eyes checked. Personal lifestyle choices, including: Daily care of your teeth and gums. Regular physical activity. Eating a healthy diet. Avoiding tobacco and drug use. Limiting alcohol use. Practicing safe sex. Taking low doses of aspirin every day. Taking vitamin and mineral supplements as recommended by your health care provider. What happens during an annual well check? The services and screenings done by your health care provider during your annual well check will depend on your age, overall health, lifestyle risk factors, and family  history of disease. Counseling  Your health care provider may ask you questions about your: Alcohol use. Tobacco use. Drug use. Emotional well-being. Home and relationship well-being. Sexual activity. Eating habits. History of falls. Memory and ability to understand (cognition). Work and work Astronomer. Screening  You may have the following tests or measurements: Height, weight, and BMI. Blood pressure. Lipid and cholesterol levels. These may be checked every 5 years, or more frequently if you are over 67 years old. Skin check. Lung cancer screening. You may have this screening every year starting at age 67 if you have a 30-pack-year history of smoking and currently smoke or have quit within the past 15 years. Fecal occult blood test (FOBT) of the stool. You may have this test every year starting at age 67. Flexible sigmoidoscopy or colonoscopy. You may have a sigmoidoscopy every 5 years or a colonoscopy every 10 years starting at age 67. Prostate cancer screening. Recommendations will vary depending on your family history and other risks. Hepatitis C blood test. Hepatitis B blood test. Sexually transmitted disease (STD) testing. Diabetes screening. This is done by checking your blood sugar (glucose) after you have not eaten for a while (fasting). You may have this done every 1-3 years. Abdominal aortic aneurysm (AAA) screening. You may need this if you are a current or former smoker. Osteoporosis. You may be screened starting at age 60 if you are at high risk. Talk with your health care provider about your test results, treatment options, and if necessary, the need for more tests. Vaccines  Your health care provider may recommend certain vaccines, such as: Influenza vaccine. This is recommended every year. Tetanus, diphtheria, and acellular pertussis (Tdap, Td) vaccine. You  may need a Td booster every 10 years. Zoster vaccine. You may need this after age 8. Pneumococcal  13-valent conjugate (PCV13) vaccine. One dose is recommended after age 31. Pneumococcal polysaccharide (PPSV23) vaccine. One dose is recommended after age 23. Talk to your health care provider about which screenings and vaccines you need and how often you need them. This information is not intended to replace advice given to you by your health care provider. Make sure you discuss any questions you have with your health care provider. Document Released: 07/20/2015 Document Revised: 03/12/2016 Document Reviewed: 04/24/2015 Elsevier Interactive Patient Education  2017 [place] Prevention in the Home Falls can cause injuries. They can happen to people of all ages. There are many things you can do to make your home safe and to help prevent falls. What can I do on the outside of my home? Regularly fix the edges of walkways and driveways and fix any cracks. Remove anything that might make you trip as you walk through a door, such as a raised step or threshold. Trim any bushes or trees on the path to your home. Use bright outdoor lighting. Clear any walking paths of anything that might make someone trip, such as rocks or tools. Regularly check to see if handrails are loose or broken. Make sure that both sides of any steps have handrails. Any raised decks and porches should have guardrails on the edges. Have any leaves, snow, or ice cleared regularly. Use sand or salt on walking paths during winter. Clean up any spills in your garage right away. This includes oil or grease spills. What can I do in the bathroom? Use night lights. Install grab bars by the toilet and in the tub and shower. Do not use towel bars as grab bars. Use non-skid mats or decals in the tub or shower. If you need to sit down in the shower, use a plastic, non-slip stool. Keep the floor dry. Clean up any water that spills on the floor as soon as it happens. Remove soap buildup in the tub or shower regularly. Attach  bath mats securely with double-sided non-slip rug tape. Do not have throw rugs and other things on the floor that can make you trip. What can I do in the bedroom? Use night lights. Make sure that you have a light by your bed that is easy to reach. Do not use any sheets or blankets that are too big for your bed. They should not hang down onto the floor. Have a firm chair that has side arms. You can use this for support while you get dressed. Do not have throw rugs and other things on the floor that can make you trip. What can I do in the kitchen? Clean up any spills right away. Avoid walking on wet floors. Keep items that you use a lot in easy-to-reach places. If you need to reach something above you, use a strong step stool that has a grab bar. Keep electrical cords out of the way. Do not use floor polish or wax that makes floors slippery. If you must use wax, use non-skid floor wax. Do not have throw rugs and other things on the floor that can make you trip. What can I do with my stairs? Do not leave any items on the stairs. Make sure that there are handrails on both sides of the stairs and use them. Fix handrails that are broken or loose. Make sure that handrails are as long as the  stairways. Check any carpeting to make sure that it is firmly attached to the stairs. Fix any carpet that is loose or worn. Avoid having throw rugs at the top or bottom of the stairs. If you do have throw rugs, attach them to the floor with carpet tape. Make sure that you have a light switch at the top of the stairs and the bottom of the stairs. If you do not have them, ask someone to add them for you. What else can I do to help prevent falls? Wear shoes that: Do not have high heels. Have rubber bottoms. Are comfortable and fit you well. Are closed at the toe. Do not wear sandals. If you use a stepladder: Make sure that it is fully opened. Do not climb a closed stepladder. Make sure that both sides of the  stepladder are locked into place. Ask someone to hold it for you, if possible. Clearly [place] and make sure that you can see: Any grab bars or handrails. First and last steps. Where the edge of each step is. Use tools that help you move around (mobility aids) if they are needed. These include: Canes. Walkers. Scooters. Crutches. Turn on the lights when you go into a dark area. Replace any light bulbs as soon as they burn out. Set up your furniture so you have a clear path. Avoid moving your furniture around. If any of your floors are uneven, fix them. If there are any pets around you, be aware of where they are. Review your medicines with your doctor. Some medicines can make you feel dizzy. This can increase your chance of falling. Ask your doctor what other things that you can do to help prevent falls. This information is not intended to replace advice given to you by your health care provider. Make sure you discuss any questions you have with your health care provider. Document Released: 04/19/2009 Document Revised: 11/29/2015 Document Reviewed: 07/28/2014 Elsevier Interactive Patient Education  2017 [employer].

## 2021-06-25 ENCOUNTER — Other Ambulatory Visit: Payer: Self-pay | Admitting: Family Medicine

## 2021-09-06 NOTE — Progress Notes (Signed)
? ?Phone: [phone] ?  ?Subjective:  ?Patient presents today for their annual physical. Chief complaint-noted.  ? ?See problem oriented charting- ?ROS- full  review of systems was completed and negative per full ros sheet ? ?The following were reviewed and entered/updated in epic: ?Past Medical History:  ?Diagnosis Date  ? Hypertension   ? ?Patient Active Problem List  ? Diagnosis Date Noted  ? Hyperglycemia 11/20/2015  ?  Priority: Medium   ? Essential hypertension 05/19/2007  ?  Priority: Medium   ? Rash and nonspecific skin eruption 07/24/2014  ?  Priority: Low  ? Erectile dysfunction 04/20/2014  ?  Priority: Low  ? Testicular hypofunction 03/21/2010  ?  Priority: Low  ? ?Past Surgical History:  ?Procedure Laterality Date  ? none    ? ?Family History  ?Problem Relation Age of Onset  ? Diabetes Mother   ? Hypertension Mother   ? Heart disease Father   ?     cabg x2, heavy smoker  ? Hyperlipidemia Father   ? Diabetes Father   ? Mental illness Father   ?     dementia  ? ? ?Medications- reviewed and updated ?Current Outpatient Medications  ?Medication Sig Dispense Refill  ? Multiple Vitamin (MULTIVITAMIN) tablet Take 1 tablet by mouth daily.      ? ramipril (ALTACE) 10 MG capsule TAKE 1 CAPSULE BY MOUTH  DAILY 90 capsule 3  ? ?No current facility-administered medications for this visit.  ? ? ?Allergies-reviewed and updated ?Allergies  ?Allergen Reactions  ? Penicillins   ?  REACTION: reaction when 66 years old  ? ? ?Social History  ? ?Social History Narrative  ? Married 2nd wife. 2 kids - healthy. 3 grandkids, last born December 2015. 1 in [place] and 1 in [place]  ? [employer] undergraduate.   ?   ? Retired 2019-   ? [employer]  ?   ? Hobbies: ride bike on [place], sports-[employer], [place] , grandkids, reading.   ?   ? ?Objective  ?Objective:  ?BP 135/76 Comment: most recent home reading  Pulse 63   Temp 97.7 ?F (36.5 ?C)   Ht 5\' 5"  (1.651 m)   Wt 178 lb 12.8 oz (81.1 kg)   SpO2 99%   BMI 29.75  kg/m?  ?Gen: NAD, resting comfortably ?HEENT: Mucous membranes are moist. Oropharynx normal. Some cerumen in right ear canal- removed probably 50% of this with curette- but was getting mild irritation on ear canal and we opted to stop ?Neck: no thyromegaly ?CV: RRR no murmurs rubs or gallops ?Lungs: CTAB no crackles, wheeze, rhonchi ?Abdomen: soft/nontender/nondistended/normal bowel sounds. No rebound or guarding.  ?Ext: no edema ?Skin: warm, dry ?Neuro: grossly normal, moves all extremities, PERRLA ?  ?Assessment and Plan  ?66 y.o. male presenting for annual physical.  ?Health Maintenance counseling: ?1. Anticipatory guidance: Patient counseled regarding regular dental exams -q3 months-  with prior dental surgery- dentist and oral surgeon , eye exams -yearly with contacts,  avoiding smoking and second hand smoke , limiting alcohol to 2 beverages per day -or less. . No illicit drugs  .   ?2. Risk factor reduction:  Advised patient of need for regular exercise and diet rich and fruits and vegetables to reduce risk of heart attack and stroke.  ?Exercise-4.5 miles walking daily weather permitting- 5 this AM!  ?Diet/weight management- weight stable. Gradual trajectory downward from beginning of epic down from 210- nice progress! Recommend continual gradual loss ?Wt Readings from Last  3 Encounters:  ?09/13/21 178 lb 12.8 oz (81.1 kg)  ?05/22/20 177 lb 6.4 oz (80.5 kg)  ?05/10/19 181 lb 9.6 oz (82.4 kg)  ?3. Immunizations/screenings/ancillary studies-fully up-to-date on vaccinations ?Immunization History  ?Administered Date(s) Administered  ? Fluad Quad(high Dose 65+) 05/10/2019, 05/22/2020  ? Influenza Split 04/29/2011, 04/23/2012  ? Influenza Whole 03/21/2010  ? Influenza, High Dose Seasonal PF 03/13/2021  ? Influenza,inj,Quad PF,6+ Mos 04/15/2013, 04/20/2014, 05/02/2015, 04/23/2016, 04/09/2017, 04/15/2018  ? PFIZER(Purple Top)SARS-COV-2 Vaccination 08/11/2019, 09/05/2019, 04/02/2020, 03/13/2021  ? Pneumococcal  Conjugate-13 05/10/2019  ? Pneumococcal Polysaccharide-23 05/22/2020  ? Td 12/28/2007  ? Tdap 12/28/2007, 02/08/2018  ? Zoster Recombinat (Shingrix) 11/16/2017, 02/08/2018  ? Zoster, Live 06/28/2012  ? 4. Prostate cancer screening-we will trend PSA-low risk prior trending. Rectal exam low risk in the past and no change in urinary symptoms   ?Lab Results  ?Component Value Date  ? PSA 0.84 05/22/2020  ? PSA 0.54 05/10/2019  ? PSA 0.78 02/08/2018  ? 5. Colon cancer screening -Cologuard 04/17/2021 with 3-year repeat planned  ?6. Skin cancer screening-dermatology yearly. advised regular sunscreen use. Denies worrisome, changing, or new skin lesions.  ?7. Smoking associated screening (lung cancer screening, AAA screen 65-75, UA)- never smoker  ?8. STD screening -monogamous so declines  ? ?Status of chronic or acute concerns  ?  ?#hypertension- usually with white coat element  ?S: medication: Ramipril 10 mg  ?Home readings #s:  135-140/75-80. 135/76 ?BP Readings from Last 3 Encounters:  ?09/13/21 135/76  ?05/22/20 132/80  ?05/10/19 130/82  ?A/P: reasonable control overall- continue current meds unless lipids increase or other signs of heart disease- father did have CABG but was heavy smoker- in his 82s ?  ?# Hyperglycemia/insulin resistance/prediabetes-blood sugars have been high but prior A1c's have been normal ?S:  Medication: None ?Lab Results  ?Component Value Date  ? HGBA1C 5.2 05/22/2020  ? HGBA1C 5.4 02/08/2018  ? HGBA1C 5.4 12/28/2007  ? A/P: will only check fasting CBG ?  ?#Screen for hyperlipidemia-lipids had been excellent in the past and we will recheck every 3-5 years ?Lab Results  ?Component Value Date  ? CHOL 117 05/10/2019  ? HDL 57.00 05/10/2019  ? LDLCALC 51 05/10/2019  ? TRIG 46.0 05/10/2019  ? CHOLHDL 2 05/10/2019  ? A/P: update lipids today and we discussed maybe being more aggressive with HTN control ? ?#tinnitus- started sometime after covid vaccines- doesn't notice always. Does not want to see ent  or audiology at this time ? ?Recommended follow up: Return in about 1 year (around 09/14/2022) for physical or sooner if needed. ?Future Appointments  ?Date Time Provider Department Center  ?06/09/2022 11:00 AM [facility]  ? ?Lab/Order associations: fasting ?  ICD-10-CM   ?1. Preventative health care  Z00.00   ?  ?2. Essential hypertension  I10   ?  ?3. Hyperglycemia  R73.9   ?  ?4. Nocturia  R35.1   ?  ? ?No orders of the defined types were placed in this encounter. ? ?I,Jada Bradford,acting as a scribe for Tana Conch, MD.,have documented all relevant documentation on the behalf of Tana Conch, MD,as directed by  Tana Conch, MD while in the presence of Tana Conch, MD. ? ?I, Tana Conch, MD, have reviewed all documentation for this visit. The documentation on 09/13/21 for the exam, diagnosis, procedures, and orders are all accurate and complete. ? ?Return precautions advised.  ?Tana Conch, MD ? ? ? ?

## 2021-09-13 ENCOUNTER — Encounter: Payer: Self-pay | Admitting: Family Medicine

## 2021-09-13 ENCOUNTER — Ambulatory Visit (INDEPENDENT_AMBULATORY_CARE_PROVIDER_SITE_OTHER): Payer: Medicare Other | Admitting: Family Medicine

## 2021-09-13 VITALS — BP 135/76 | HR 63 | Temp 97.7°F | Ht 65.0 in | Wt 178.8 lb

## 2021-09-13 DIAGNOSIS — Z0000 Encounter for general adult medical examination without abnormal findings: Secondary | ICD-10-CM | POA: Diagnosis not present

## 2021-09-13 DIAGNOSIS — I10 Essential (primary) hypertension: Secondary | ICD-10-CM | POA: Diagnosis not present

## 2021-09-13 DIAGNOSIS — Z13220 Encounter for screening for lipoid disorders: Secondary | ICD-10-CM

## 2021-09-13 DIAGNOSIS — R351 Nocturia: Secondary | ICD-10-CM

## 2021-09-13 DIAGNOSIS — R739 Hyperglycemia, unspecified: Secondary | ICD-10-CM | POA: Diagnosis not present

## 2021-09-13 LAB — CBC WITH DIFFERENTIAL/PLATELET
Basophils Absolute: 0.1 10*3/uL (ref 0.0–0.1)
Basophils Relative: 0.7 % (ref 0.0–3.0)
Eosinophils Absolute: 0.1 10*3/uL (ref 0.0–0.7)
Eosinophils Relative: 1.6 % (ref 0.0–5.0)
HCT: 44.7 % (ref 39.0–52.0)
Hemoglobin: 15.6 g/dL (ref 13.0–17.0)
Lymphocytes Relative: 24.1 % (ref 12.0–46.0)
Lymphs Abs: 1.8 10*3/uL (ref 0.7–4.0)
MCHC: 35 g/dL (ref 30.0–36.0)
MCV: 90.8 fl (ref 78.0–100.0)
Monocytes Absolute: 0.5 10*3/uL (ref 0.1–1.0)
Monocytes Relative: 6.6 % (ref 3.0–12.0)
Neutro Abs: 5.1 10*3/uL (ref 1.4–7.7)
Neutrophils Relative %: 67 % (ref 43.0–77.0)
Platelets: 195 10*3/uL (ref 150.0–400.0)
RBC: 4.93 Mil/uL (ref 4.22–5.81)
RDW: 13.1 % (ref 11.5–15.5)
WBC: 7.7 10*3/uL (ref 4.0–10.5)

## 2021-09-13 LAB — COMPREHENSIVE METABOLIC PANEL
ALT: 25 U/L (ref 0–53)
AST: 23 U/L (ref 0–37)
Albumin: 4.7 g/dL (ref 3.5–5.2)
Alkaline Phosphatase: 68 U/L (ref 39–117)
BUN: 15 mg/dL (ref 6–23)
CO2: 28 mEq/L (ref 19–32)
Calcium: 9.9 mg/dL (ref 8.4–10.5)
Chloride: 102 mEq/L (ref 96–112)
Creatinine, Ser: 0.97 mg/dL (ref 0.40–1.50)
GFR: 80.86 mL/min (ref >60.00)
Glucose, Bld: 100 mg/dL — ABNORMAL HIGH (ref 70–99)
Potassium: 4.9 mEq/L (ref 3.5–5.1)
Sodium: 138 mEq/L (ref 135–145)
Total Bilirubin: 0.8 mg/dL (ref 0.2–1.2)
Total Protein: 7.2 g/dL (ref 6.0–8.3)

## 2021-09-13 LAB — LIPID PANEL
Cholesterol: 91 mg/dL (ref 0–200)
HDL: 55.3 mg/dL (ref >39.00)
LDL Cholesterol: 28 mg/dL (ref 0–99)
NonHDL: 36.09
Total CHOL/HDL Ratio: 2
Triglycerides: 39 mg/dL (ref 0.0–149.0)
VLDL: 7.8 mg/dL (ref 0.0–40.0)

## 2021-09-13 LAB — PSA: PSA: 0.58 ng/mL (ref 0.10–4.00)

## 2021-09-13 NOTE — Patient Instructions (Addendum)
Please stop by lab before you go ?If you have mychart- we will send your results within 3 business days of [place] receiving them.  ?If you do not have mychart- we will call you about results within 5 business days of [place] receiving them.  ?*please also note that you will see labs on mychart as soon as they post. I will later go in and write notes on them- will say "notes from Dr. Durene Cal"  ? ?We attempted to curette/remove earwax but your your canal is getting mildly irritated-I would not be surprised if you have a light amount of bleeding from the this evening.  Reasonable to try Debrox starting tomorrow over-the-counter ?- If you have hearing loss or do not feel that Debrox is effective-we can place referral to ear nose and throat ? ?Goal blood pressure at home less than 135/85 on average ? ?Recommended follow up: Return in about 1 year (around 09/14/2022) for physical or sooner if needed. ?

## 2021-11-02 ENCOUNTER — Encounter: Payer: Self-pay | Admitting: Family Medicine

## 2021-11-26 DIAGNOSIS — L570 Actinic keratosis: Secondary | ICD-10-CM | POA: Diagnosis not present

## 2021-11-26 DIAGNOSIS — L821 Other seborrheic keratosis: Secondary | ICD-10-CM | POA: Diagnosis not present

## 2022-03-25 ENCOUNTER — Other Ambulatory Visit: Payer: Self-pay | Admitting: Family Medicine

## 2022-03-31 ENCOUNTER — Encounter: Payer: Self-pay | Admitting: *Deleted

## 2022-03-31 ENCOUNTER — Encounter: Payer: Self-pay | Admitting: Family Medicine

## 2022-06-09 ENCOUNTER — Ambulatory Visit (INDEPENDENT_AMBULATORY_CARE_PROVIDER_SITE_OTHER): Payer: Medicare Other

## 2022-06-09 VITALS — Wt 175.0 lb

## 2022-06-09 DIAGNOSIS — Z0000 Encounter for general adult medical examination without abnormal findings: Secondary | ICD-10-CM | POA: Diagnosis not present

## 2022-06-09 NOTE — Patient Instructions (Signed)
[name] , Thank you for taking time to come for your Medicare Wellness Visit. I appreciate your ongoing commitment to your health goals. Please review the following plan we discussed and let me know if I can assist you in the future.   These are the goals we discussed:  Goals      Patient Stated     Stay Healthy     Patient Stated     Stay active and maintain weight      Patient Stated     Stay healthy and active         This is a list of the screening recommended for you and due dates:  Health Maintenance  Topic Date Due   COVID-19 Vaccine (7 - 2023-24 season) 05/26/2022   Medicare Annual Wellness Visit  06/10/2023   Cologuard (Stool DNA test)  04/17/2024   DTaP/Tdap/Td vaccine (4 - Td or Tdap) 02/09/2028   Pneumonia Vaccine  Completed   Flu Shot  Completed   Hepatitis C Screening: USPSTF Recommendation to screen - Ages 18-79 yo.  Completed   Zoster (Shingles) Vaccine  Completed   HPV Vaccine  Aged Out    Advanced directives: Please bring a copy of your health care power of attorney and living will to the office at your convenience.  Conditions/risks identified: stay healthy and active   Next appointment: Follow up in one year for your annual wellness visit.   Preventive Care 28 Years and Older, Male  Preventive care refers to lifestyle choices and visits with your health care provider that can promote health and wellness. What does preventive care include? A yearly physical exam. This is also called an annual well check. Dental exams once or twice a year. Routine eye exams. Ask your health care provider how often you should have your eyes checked. Personal lifestyle choices, including: Daily care of your teeth and gums. Regular physical activity. Eating a healthy diet. Avoiding tobacco and drug use. Limiting alcohol use. Practicing safe sex. Taking low doses of aspirin every day. Taking vitamin and mineral supplements as recommended by your health care  provider. What happens during an annual well check? The services and screenings done by your health care provider during your annual well check will depend on your age, overall health, lifestyle risk factors, and family history of disease. Counseling  Your health care provider may ask you questions about your: Alcohol use. Tobacco use. Drug use. Emotional well-being. Home and relationship well-being. Sexual activity. Eating habits. History of falls. Memory and ability to understand (cognition). Work and work Astronomer. Screening  You may have the following tests or measurements: Height, weight, and BMI. Blood pressure. Lipid and cholesterol levels. These may be checked every 5 years, or more frequently if you are over 14 years old. Skin check. Lung cancer screening. You may have this screening every year starting at age 69 if you have a 30-pack-year history of smoking and currently smoke or have quit within the past 15 years. Fecal occult blood test (FOBT) of the stool. You may have this test every year starting at age 101. Flexible sigmoidoscopy or colonoscopy. You may have a sigmoidoscopy every 5 years or a colonoscopy every 10 years starting at age 34. Prostate cancer screening. Recommendations will vary depending on your family history and other risks. Hepatitis C blood test. Hepatitis B blood test. Sexually transmitted disease (STD) testing. Diabetes screening. This is done by checking your blood sugar (glucose) after you have not eaten for a  while (fasting). You may have this done every 1-3 years. Abdominal aortic aneurysm (AAA) screening. You may need this if you are a current or former smoker. Osteoporosis. You may be screened starting at age 91 if you are at high risk. Talk with your health care provider about your test results, treatment options, and if necessary, the need for more tests. Vaccines  Your health care provider may recommend certain vaccines, such  as: Influenza vaccine. This is recommended every year. Tetanus, diphtheria, and acellular pertussis (Tdap, Td) vaccine. You may need a Td booster every 10 years. Zoster vaccine. You may need this after age 99. Pneumococcal 13-valent conjugate (PCV13) vaccine. One dose is recommended after age 48. Pneumococcal polysaccharide (PPSV23) vaccine. One dose is recommended after age 53. Talk to your health care provider about which screenings and vaccines you need and how often you need them. This information is not intended to replace advice given to you by your health care provider. Make sure you discuss any questions you have with your health care provider. Document Released: 07/20/2015 Document Revised: 03/12/2016 Document Reviewed: 04/24/2015 Elsevier Interactive Patient Education  2017 [place] Prevention in the Home Falls can cause injuries. They can happen to people of all ages. There are many things you can do to make your home safe and to help prevent falls. What can I do on the outside of my home? Regularly fix the edges of walkways and driveways and fix any cracks. Remove anything that might make you trip as you walk through a door, such as a raised step or threshold. Trim any bushes or trees on the path to your home. Use bright outdoor lighting. Clear any walking paths of anything that might make someone trip, such as rocks or tools. Regularly check to see if handrails are loose or broken. Make sure that both sides of any steps have handrails. Any raised decks and porches should have guardrails on the edges. Have any leaves, snow, or ice cleared regularly. Use sand or salt on walking paths during winter. Clean up any spills in your garage right away. This includes oil or grease spills. What can I do in the bathroom? Use night lights. Install grab bars by the toilet and in the tub and shower. Do not use towel bars as grab bars. Use non-skid mats or decals in the tub or  shower. If you need to sit down in the shower, use a plastic, non-slip stool. Keep the floor dry. Clean up any water that spills on the floor as soon as it happens. Remove soap buildup in the tub or shower regularly. Attach bath mats securely with double-sided non-slip rug tape. Do not have throw rugs and other things on the floor that can make you trip. What can I do in the bedroom? Use night lights. Make sure that you have a light by your bed that is easy to reach. Do not use any sheets or blankets that are too big for your bed. They should not hang down onto the floor. Have a firm chair that has side arms. You can use this for support while you get dressed. Do not have throw rugs and other things on the floor that can make you trip. What can I do in the kitchen? Clean up any spills right away. Avoid walking on wet floors. Keep items that you use a lot in easy-to-reach places. If you need to reach something above you, use a strong step stool that has a grab  bar. Keep electrical cords out of the way. Do not use floor polish or wax that makes floors slippery. If you must use wax, use non-skid floor wax. Do not have throw rugs and other things on the floor that can make you trip. What can I do with my stairs? Do not leave any items on the stairs. Make sure that there are handrails on both sides of the stairs and use them. Fix handrails that are broken or loose. Make sure that handrails are as long as the stairways. Check any carpeting to make sure that it is firmly attached to the stairs. Fix any carpet that is loose or worn. Avoid having throw rugs at the top or bottom of the stairs. If you do have throw rugs, attach them to the floor with carpet tape. Make sure that you have a light switch at the top of the stairs and the bottom of the stairs. If you do not have them, ask someone to add them for you. What else can I do to help prevent falls? Wear shoes that: Do not have high heels. Have  rubber bottoms. Are comfortable and fit you well. Are closed at the toe. Do not wear sandals. If you use a stepladder: Make sure that it is fully opened. Do not climb a closed stepladder. Make sure that both sides of the stepladder are locked into place. Ask someone to hold it for you, if possible. Clearly [place]robert and make sure that you can see: Any grab bars or handrails. First and last steps. Where the edge of each step is. Use tools that help you move around (mobility aids) if they are needed. These include: Canes. Walkers. Scooters. Crutches. Turn on the lights when you go into a dark area. Replace any light bulbs as soon as they burn out. Set up your furniture so you have a clear path. Avoid moving your furniture around. If any of your floors are uneven, fix them. If there are any pets around you, be aware of where they are. Review your medicines with your doctor. Some medicines can make you feel dizzy. This can increase your chance of falling. Ask your doctor what other things that you can do to help prevent falls. This information is not intended to replace advice given to you by your health care provider. Make sure you discuss any questions you have with your health care provider. Document Released: 04/19/2009 Document Revised: 11/29/2015 Document Reviewed: 07/28/2014 Elsevier Interactive Patient Education  2017 [employer].

## 2022-06-09 NOTE — Progress Notes (Signed)
I connected with  Douglas Delgado on 06/09/22 by a audio enabled telemedicine application and verified that I am speaking with the correct person using two identifiers.  Patient Location: Home  Provider Location: Office/Clinic  I discussed the limitations of evaluation and management by telemedicine. The patient expressed understanding and agreed to proceed.   Subjective:   Douglas Delgado is a 66 y.o. male who presents for Medicare Annual/Subsequent preventive examination.  Review of Systems     Cardiac Risk Factors include: advanced age (>8955men, 45>65 women);hypertension;male gender     Objective:    Today's Vitals   06/09/22 1057  Delgado: 175 lb (79.4 kg)   Body mass index is 29.12 kg/m.     06/09/2022   11:01 AM 05/27/2021   11:06 AM 05/14/2020   11:06 AM  Advanced Directives  Does Patient Have a Medical Advance Directive? Yes Yes Yes  Type of Estate agentAdvance Directive Healthcare Power of [place]Attorney;Living will Living will Healthcare Power of [place]orney;Living will  Copy of Healthcare Power of Attorney in Chart? No - copy requested  No - copy requested    Current Medications (verified) Outpatient Encounter Medications as of 06/09/2022  Medication Sig   Multiple Vitamin (MULTIVITAMIN) tablet Take 1 tablet by mouth daily.     ramipril (ALTACE) 10 MG capsule TAKE 1 CAPSULE BY MOUTH DAILY   No facility-administered encounter medications on file as of 06/09/2022.    Allergies (verified) Penicillins   History: Past Medical History:  Diagnosis Date   Hypertension    Past Surgical History:  Procedure Laterality Date   none     Family History  Problem Relation Age of Onset   Diabetes Mother    Hypertension Mother    Heart disease Father        cabg x2, heavy smoker   Hyperlipidemia Father    Diabetes Father    Mental illness Father        dementia   Social History   Socioeconomic History   Marital status: Married    Spouse name: Not on file   Number of children: Not on file    Years of education: Not on file   Highest education level: Not on file  Occupational History   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 2 kids - healthy. 3 grandkids, last born December 2015. [place]   [employer] [place].       Retired 2019-    [employer]O [employer]      Hobbies: ride bike [place] [address], sports-[employer], [place] , grandkids, reading.       Social Determinants of Health   Financial Resource Strain: Low Risk  (06/09/2022)   Overall Financial Resource Strain (CARDIA)    Difficulty of Paying Living Expenses: Not hard at all  Food Insecurity: No Food Insecurity (05/27/2021)   Hunger Vital Sign    Worried About Running Out of Food in the Last Year: Never true    Ran Out of Food in the Last Year: Never true  Transportation Needs: No Transportation Needs (05/27/2021)  PRAPARE - Administrator, Civil Service (Medical): No    Lack of Transportation (Non-Medical): No  Physical Activity: Sufficiently Active (06/09/2022)   Exercise Vital Sign    Days of Exercise per Week: 7 days    Minutes of Exercise per Session: 90 min  Stress: No Stress Concern Present (06/09/2022)   [employer] of Occupational Health - Occupational Stress Questionnaire    Feeling of Stress : Not at all  Social Connections: Moderately Integrated (06/09/2022)   Social Connection and Isolation Panel [NHANES]    Frequency of Communication with Friends and Family: More than three times a week    Frequency of Social Gatherings with Friends and Family: More than three times a week    Attends Religious Services: 1 to 4 times per year    Active  Member of [employer] or Organizations: No    Attends Engineer, structural: Never    Marital Status: Married    Tobacco Counseling Counseling given: Not Answered   Clinical Intake:  Pre-visit preparation completed: Yes  Pain : No/denies pain     BMI - recorded: 29.12 Nutritional Status: BMI 25 -29 Overweight Nutritional Risks: None Diabetes: No  How often do you need to have someone help you when you read instructions, pamphlets, or other written materials from your doctor or pharmacy?: 1 - Never  Diabetic?no  Interpreter Needed?: No  Information entered by :: Lanier Ensign, LPN   Activities of Daily Living    06/09/2022   11:02 AM 06/05/2022    8:42 AM  In your present state of health, do you have any difficulty performing the following activities:  Hearing? 0 0  Vision? 0 0  Difficulty concentrating or making decisions? 0 0  Walking or climbing stairs? 0 0  Dressing or bathing? 0 0  Doing errands, shopping? 0 0  Preparing Food and eating ? N N  Using the Toilet? N N  In the past six months, have you accidently leaked urine? N N  Do you have problems with loss of bowel control? N N  Managing your Medications? N N  Managing your Finances? N N  Housekeeping or managing your Housekeeping? N N    Patient Care Team: Shelva Majestic, MD as PCP - General (Family Medicine)  Indicate any recent Medical Services you may have received from other than [facility] providers in the past year (date may be approximate).     Assessment:   This is a routine wellness examination for Douglas Delgado's.  Hearing/Vision screen Hearing Screening - Comments:: Denies any hearing issues  Vision Screening - Comments:: Pt follows up with Dr Blima Ledger for annual eye exams   Dietary issues and exercise activities discussed: Current Exercise Habits: Home exercise routine, Type of exercise: Other - see comments;walking, Time (Minutes): > 60, Frequency (Times/Week): 7, Weekly Exercise  (Minutes/Week): 0   Goals Addressed             This Visit's Progress    Patient Stated       Stay healthy and active        Depression Screen    06/09/2022   11:00 AM 05/27/2021   11:05 AM 05/14/2020   11:04 AM 05/10/2019    9:31 AM 02/08/2018    8:17 AM 11/20/2015    1:15 PM  PHQ 2/9 Scores  PHQ - 2 Score 0 0 0 0 0 0    Fall Risk    06/09/2022   11:02 AM 06/05/2022  8:42 AM 05/27/2021   11:07 AM 05/14/2020   11:06 AM 02/08/2018    8:17 AM  Fall Risk   Falls in the past year? 0 0 0 0 No  Number falls in past yr: 0 0 0 0   Injury with Fall? 0 0 0 0   Risk for fall due to : Impaired vision  Impaired vision Impaired vision   Follow up Falls prevention discussed  Falls prevention discussed Falls prevention discussed     FALL RISK PREVENTION PERTAINING TO THE HOME:  Any stairs in or around the home? Yes  If so, are there any without handrails? No  Home free of loose throw rugs in walkways, pet beds, electrical cords, etc? Yes  Adequate lighting in your home to reduce risk of falls? Yes   ASSISTIVE DEVICES UTILIZED TO PREVENT FALLS:  Life alert? No  Use of a cane, walker or w/c? No  Grab bars in the bathroom? No  Shower chair or bench in shower? Yes  Elevated toilet seat or a handicapped toilet? No   TIMED UP AND GO:  Was the test performed? No .   Cognitive Function:        06/09/2022   11:02 AM 05/27/2021   11:08 AM 05/14/2020   11:08 AM  6CIT Screen  What Year? 0 points 0 points 0 points  What month? 0 points 0 points 0 points  What time? 0 points 0 points   Count back from 20 0 points 0 points 0 points  Months in reverse 0 points 0 points 0 points  Repeat phrase 0 points 0 points 0 points  Total Score 0 points 0 points     Immunizations Immunization History  Administered Date(s) Administered   Fluad Quad(high Dose 65+) 05/10/2019, 05/22/2020, 03/31/2022   Influenza Split 04/29/2011, 04/23/2012   Influenza Whole 03/21/2010   Influenza, High  Dose Seasonal PF 03/13/2021, 03/31/2022   Influenza,inj,Quad PF,6+ Mos 04/15/2013, 04/20/2014, 05/02/2015, 04/23/2016, 04/09/2017, 04/15/2018   PFIZER Comirnaty(Gray Top)Covid-19 Tri-Sucrose Vaccine 03/31/2022   PFIZER(Purple Top)SARS-COV-2 Vaccination 08/11/2019, 09/05/2019, 04/02/2020, 03/13/2021   Pfizer Covid-19 Vaccine Bivalent Booster 21yrs & up 11/02/2021   Pneumococcal Conjugate-13 05/10/2019   Pneumococcal Polysaccharide-23 05/22/2020   Td 12/28/2007   Tdap 12/28/2007, 02/08/2018   Zoster Recombinat (Shingrix) 11/16/2017, 02/08/2018   Zoster, Live 06/28/2012    TDAP status: Up to date  Flu Vaccine status: Up to date  Pneumococcal vaccine status: Up to date  Covid-19 vaccine status: Completed vaccines  Qualifies for Shingles Vaccine? Yes   Zostavax completed Yes   Shingrix Completed?: Yes  Screening Tests Health Maintenance  Topic Date Due   COVID-19 Vaccine (7 - 2023-24 season) 05/26/2022   Medicare Annual Wellness (AWV)  06/10/2023   Fecal DNA (Cologuard)  04/17/2024   DTaP/Tdap/Td (4 - Td or Tdap) 02/09/2028   Pneumonia Vaccine 73+ Years old  Completed   INFLUENZA VACCINE  Completed   Hepatitis C Screening  Completed   Zoster Vaccines- Shingrix  Completed   HPV VACCINES  Aged Out    Health Maintenance  Health Maintenance Due  Topic Date Due   COVID-19 Vaccine (7 - 2023-24 season) 05/26/2022    Colorectal cancer screening: Type of screening: Cologuard. Completed 04/17/21. Repeat every 3 years  Additional Screening:  Hepatitis C Screening: Completed 11/13/15  Vision Screening: Recommended annual ophthalmology exams for early detection of glaucoma and other disorders of the eye. Is the patient up to date with their annual eye exam?  Yes  Who  is the provider or what is the name of the office in which the patient attends annual eye exams? Dr Marica Otter  If pt is not established with a provider, would they like to be referred to a provider to establish  care? No .   Dental Screening: Recommended annual dental exams for proper oral hygiene  Community Resource Referral / Chronic Care Management: CRR required this visit?  No   CCM required this visit?  No      Plan:     I have personally reviewed and noted the following in the patient's chart:   Medical and social history Use of alcohol, tobacco or illicit drugs  Current medications and supplements including opioid prescriptions. Patient is not currently taking opioid prescriptions. Functional ability and status Nutritional status Physical activity Advanced directives List of other physicians Hospitalizations, surgeries, and ER visits in previous 12 months Vitals Screenings to include cognitive, depression, and falls Referrals and appointments  In addition, I have reviewed and discussed with patient certain preventive protocols, quality metrics, and best practice recommendations. A written personalized care plan for preventive services as well as general preventive health recommendations were provided to patient.     Willette Brace, LPN   [phone]   Nurse Notes: none

## 2022-09-29 ENCOUNTER — Encounter: Payer: Medicare Other | Admitting: Family Medicine

## 2022-11-04 ENCOUNTER — Encounter: Payer: Self-pay | Admitting: Family Medicine

## 2022-11-04 ENCOUNTER — Ambulatory Visit (INDEPENDENT_AMBULATORY_CARE_PROVIDER_SITE_OTHER): Payer: Medicare Other | Admitting: Family Medicine

## 2022-11-04 VITALS — BP 130/75 | HR 73 | Temp 98.7°F | Ht 65.0 in | Wt 175.2 lb

## 2022-11-04 DIAGNOSIS — Z131 Encounter for screening for diabetes mellitus: Secondary | ICD-10-CM | POA: Diagnosis not present

## 2022-11-04 DIAGNOSIS — Z0000 Encounter for general adult medical examination without abnormal findings: Secondary | ICD-10-CM | POA: Diagnosis not present

## 2022-11-04 DIAGNOSIS — I10 Essential (primary) hypertension: Secondary | ICD-10-CM

## 2022-11-04 DIAGNOSIS — Z125 Encounter for screening for malignant neoplasm of prostate: Secondary | ICD-10-CM

## 2022-11-04 DIAGNOSIS — R739 Hyperglycemia, unspecified: Secondary | ICD-10-CM | POA: Diagnosis not present

## 2022-11-04 LAB — LIPID PANEL
Cholesterol: 96 mg/dL (ref 0–200)
HDL: 53.2 mg/dL (ref >39.00)
LDL Cholesterol: 32 mg/dL (ref 0–99)
NonHDL: 43.08
Total CHOL/HDL Ratio: 2
Triglycerides: 53 mg/dL (ref 0.0–149.0)
VLDL: 10.6 mg/dL (ref 0.0–40.0)

## 2022-11-04 LAB — COMPREHENSIVE METABOLIC PANEL
ALT: 21 U/L (ref 0–53)
AST: 21 U/L (ref 0–37)
Albumin: 4.4 g/dL (ref 3.5–5.2)
Alkaline Phosphatase: 71 U/L (ref 39–117)
BUN: 19 mg/dL (ref 6–23)
CO2: 27 mEq/L (ref 19–32)
Calcium: 9.5 mg/dL (ref 8.4–10.5)
Chloride: 102 mEq/L (ref 96–112)
Creatinine, Ser: 0.98 mg/dL (ref 0.40–1.50)
GFR: 79.23 mL/min (ref >60.00)
Glucose, Bld: 113 mg/dL — ABNORMAL HIGH (ref 70–99)
Potassium: 4.4 mEq/L (ref 3.5–5.1)
Sodium: 137 mEq/L (ref 135–145)
Total Bilirubin: 0.7 mg/dL (ref 0.2–1.2)
Total Protein: 7.3 g/dL (ref 6.0–8.3)

## 2022-11-04 LAB — CBC WITH DIFFERENTIAL/PLATELET
Basophils Absolute: 0.1 10*3/uL (ref 0.0–0.1)
Basophils Relative: 0.9 % (ref 0.0–3.0)
Eosinophils Absolute: 0.2 10*3/uL (ref 0.0–0.7)
Eosinophils Relative: 2.9 % (ref 0.0–5.0)
HCT: 44.9 % (ref 39.0–52.0)
Hemoglobin: 15.8 g/dL (ref 13.0–17.0)
Lymphocytes Relative: 27.3 % (ref 12.0–46.0)
Lymphs Abs: 1.6 10*3/uL (ref 0.7–4.0)
MCHC: 35.2 g/dL (ref 30.0–36.0)
MCV: 92.1 fl (ref 78.0–100.0)
Monocytes Absolute: 0.4 10*3/uL (ref 0.1–1.0)
Monocytes Relative: 7.2 % (ref 3.0–12.0)
Neutro Abs: 3.6 10*3/uL (ref 1.4–7.7)
Neutrophils Relative %: 61.7 % (ref 43.0–77.0)
Platelets: 212 10*3/uL (ref 150.0–400.0)
RBC: 4.88 Mil/uL (ref 4.22–5.81)
RDW: 13.3 % (ref 11.5–15.5)
WBC: 5.8 10*3/uL (ref 4.0–10.5)

## 2022-11-04 LAB — HEMOGLOBIN A1C: Hgb A1c MFr Bld: 5.5 % (ref 4.6–6.5)

## 2022-11-04 LAB — PSA, MEDICARE: PSA: 1.04 ng/ml (ref 0.10–4.00)

## 2022-11-04 NOTE — Patient Instructions (Addendum)
Please stop by lab before you go If you have mychart- we will send your results within 3 business days of [place] receiving them.  If you do not have mychart- we will call you about results within 5 business days of [place] receiving them.  *please also note that you will see labs on mychart as soon as they post. I will later go in and write notes on them- will say "notes from Dr. Durene Cal"   Recommended follow up: Return in about 1 year (around 11/04/2023) for physical or sooner if needed.Schedule b4 you leave. -as long as BP remains <135/85 on average at home and you are monitoring

## 2022-11-04 NOTE — Progress Notes (Signed)
Phone: [phone]   Subjective:  Patient presents today for their annual physical. Chief complaint-noted.   See problem oriented charting- ROS- full  review of systems was completed and negative  Per full ROS sheet completed by patient  The following were reviewed and entered/updated in epic: Past Medical History:  Diagnosis Date   Hypertension    Patient Active Problem List   Diagnosis Date Noted   Hyperglycemia 11/20/2015    Priority: Medium    Essential hypertension 05/19/2007    Priority: Medium    Rash and nonspecific skin eruption 07/24/2014    Priority: Low   Erectile dysfunction 04/20/2014    Priority: Low   Testicular hypofunction 03/21/2010    Priority: Low   Past Surgical History:  Procedure Laterality Date   none      Family History  Problem Relation Age of Onset   Diabetes Mother    Hypertension Mother    Heart disease Father        cabg x2, heavy smoker   Hyperlipidemia Father    Diabetes Father    Mental illness Father        dementia    Medications- reviewed and updated Current Outpatient Medications  Medication Sig Dispense Refill   Multiple Vitamin (MULTIVITAMIN) tablet Take 1 tablet by mouth daily.       ramipril (ALTACE) 10 MG capsule TAKE 1 CAPSULE BY MOUTH DAILY 100 capsule 2   No current facility-administered medications for this visit.    Allergies-reviewed and updated Allergies  Allergen Reactions   Penicillins     REACTION: reaction when 67 years old    Social History   Social History Narrative   Married 2nd wife. 2 kids - healthy. 3 grandkids, last born December 2015. 1 in [place] and 1 in [facility]   [employer] undergraduate.       Retired 2019-    [employer]O [employer]      Hobbies: ride bike on [place], sports-[employer], [place] , grandkids, reading.       Objective  Objective:  BP 130/75 Comment: most recent home reading  Pulse 73   Temp 98.7 F (37.1 C)   Ht 5\' 5"  (1.651 m)   Wt 175 lb 3.2 oz (79.5 kg)    SpO2 95%   BMI 29.15 kg/m  Gen: NAD, resting comfortably HEENT: Mucous membranes are moist. Oropharynx normal Neck: no thyromegaly CV: RRR no murmurs rubs or gallops Lungs: CTAB no crackles, wheeze, rhonchi Abdomen: soft/nontender/nondistended/normal bowel sounds. No rebound or guarding.  Ext: no edema Skin: warm, dry, 5 x 5 cm raised area on left shoulder- likely lipoma Neuro: grossly normal, moves all extremities, PERRLA    Assessment and Plan  67 y.o. male presenting for annual physical.  Health Maintenance counseling: 1. Anticipatory guidance: Patient counseled regarding regular dental exams -q3 months- dentist and oral surgeon, eye exams -contacts so goes yearly,  avoiding smoking and second hand smoke , limiting alcohol to 2 beverages per day - well under- 1 a month maybe, no illicit drugs.   2. Risk factor reduction:  Advised patient of need for regular exercise and diet rich and fruits and vegetables to reduce risk of heart attack and stroke.  Exercise- regular walking 5 miles weather permitting with shorter walk in afternoon- over 20k steps a day Diet/weight management- down 3 lbs from last year. Did eat less while wife was ill with gallbladder issues Wt Readings from Last 3 Encounters:  11/04/22 175 lb 3.2 oz (79.5  kg)  06/09/22 175 lb (79.4 kg)  09/13/21 178 lb 12.8 oz (81.1 kg)  3. Immunizations/screenings/ancillary studies- up to date  Immunization History  Administered Date(s) Administered   Fluad Quad(high Dose 65+) 05/10/2019, 05/22/2020, 03/31/2022   Influenza Split 04/29/2011, 04/23/2012   Influenza Whole 03/21/2010   Influenza, High Dose Seasonal PF 03/13/2021, 03/31/2022   Influenza,inj,Quad PF,6+ Mos 04/15/2013, 04/20/2014, 05/02/2015, 04/23/2016, 04/09/2017, 04/15/2018   PFIZER Comirnaty(Gray Top)Covid-19 Tri-Sucrose Vaccine 03/31/2022, 09/29/2022   PFIZER(Purple Top)SARS-COV-2 Vaccination 08/11/2019, 09/05/2019, 04/02/2020, 03/13/2021   Pfizer Covid-19  Vaccine Bivalent Booster 59yrs & up 11/02/2021   Pneumococcal Conjugate-13 05/10/2019   Pneumococcal Polysaccharide-23 05/22/2020   Td 12/28/2007   Tdap 12/28/2007, 02/08/2018   Zoster Recombinat (Shingrix) 11/16/2017, 02/08/2018   Zoster, Live 06/28/2012   4. Prostate cancer screening-  prior low risk trend- monitor with labs  Lab Results  Component Value Date   PSA 0.58 09/13/2021   PSA 0.84 05/22/2020   PSA 0.54 05/10/2019   5. Colon cancer screening -Cologuard 04/17/2021 with 3-year repeat planned   6. Skin cancer screening-dermatology specialists  yearly around may. advised regular sunscreen use. Denies worrisome, changing, or new skin lesions- other than lipoma listed below.  7. Smoking associated screening (lung cancer screening, AAA screen 65-75, UA)- never smoker  8. STD screening -monogamous so declines    Status of chronic or acute concerns   #social update- thought he had flu most likely in January early in month- was very tough on him -did spring training for [employer] in [place]! -plans to start traveling more  #hypertension- white coat element S: medication: Ramipril 10 mg Home readings #s: 128/77 last week which is lowest he has seen usually low 130s over 70s BP Readings from Last 3 Encounters:  11/04/22 130/75  09/13/21 135/76  05/22/20 132/80  A/P: stable- continue current medicines      # Hyperglycemia/insulin resistance/prediabetes-fasting sugars high in the past but A1c not elevated S:  Medication: None Exercise and diet- excellent- see above  A/P: hopefully stable- update a1c today. Continue without meds for now    # Tinnitus-started after COVID vaccines-has improved with time- not major issue    #Lipoma most likely on left shoulder about 5 x 5 cm  Recommended follow up: Return in about 1 year (around 11/04/2023) for physical or sooner if needed.Schedule b4 you leave. Future Appointments  Date Time Provider Department Center  06/15/2023 11:00 AM [facility]  ANNUAL WELLNESS VISIT 1 [facility]   Lab/Order associations: fasting   ICD-10-CM   1. Preventative health care  Z00.00     2. Screening for prostate cancer  Z12.5 PSA, Medicare ( [facility] only)    3. Screening for diabetes mellitus  Z13.1 HgB A1c    4. Essential hypertension  I10 CBC with Differential/Platelet    Comprehensive metabolic panel    Lipid panel    5. Hyperglycemia  R73.9 HgB A1c     Return precautions advised.  Tana Conch, MD

## 2022-12-30 ENCOUNTER — Other Ambulatory Visit: Payer: Self-pay | Admitting: Family Medicine

## 2023-04-14 ENCOUNTER — Encounter: Payer: Self-pay | Admitting: Family Medicine

## 2023-06-15 ENCOUNTER — Ambulatory Visit (INDEPENDENT_AMBULATORY_CARE_PROVIDER_SITE_OTHER): Payer: Medicare Other

## 2023-06-15 VITALS — Wt 175.0 lb

## 2023-06-15 DIAGNOSIS — Z0000 Encounter for general adult medical examination without abnormal findings: Secondary | ICD-10-CM | POA: Diagnosis not present

## 2023-06-15 NOTE — Patient Instructions (Signed)
Douglas Delgado , Thank you for taking time to come for your Medicare Wellness Visit. I appreciate your ongoing commitment to your health goals. Please review the following plan we discussed and let me know if I can assist you in the future.   Referrals/Orders/Follow-Ups/Clinician Recommendations: Aim for 30 minutes of exercise or brisk walking, 6-8 glasses of water, and 5 servings of fruits and vegetables each day.   This is a list of the screening recommended for you and due dates:  Health Maintenance  Topic Date Due   COVID-19 Vaccine (9 - 2023-24 season) 08/15/2023   Cologuard (Stool DNA test)  04/17/2024   Medicare Annual Wellness Visit  06/14/2024   DTaP/Tdap/Td vaccine (4 - Td or Tdap) 02/09/2028   Pneumonia Vaccine  Completed   Flu Shot  Completed   Hepatitis C Screening  Completed   Zoster (Shingles) Vaccine  Completed   HPV Vaccine  Aged Out    Advanced directives: (In Chart) A copy of your advanced directives are scanned into your chart should your provider ever need it.  Next Medicare Annual Wellness Visit scheduled for next year: Yes

## 2023-06-15 NOTE — Progress Notes (Signed)
Subjective:   Douglas Delgado is a 67 y.o. male who presents for Medicare Annual/Subsequent preventive examination.  Visit Complete: Virtual I connected with  Douglas Delgado on 06/15/23 by a audio enabled telemedicine application and verified that I am speaking with the correct person using two identifiers.  Patient Location: Home  Provider Location: Office/Clinic  I discussed the limitations of evaluation and management by telemedicine. The patient expressed understanding and agreed to proceed.  Vital Signs: Because this visit was a virtual/telehealth visit, some criteria may be missing or patient reported. Any vitals not documented were not able to be obtained and vitals that have been documented are patient reported.  Patient Medicare AWV questionnaire was completed by the patient on 06/11/23; I have confirmed that all information answered by patient is correct and no changes since this date.  Cardiac Risk Factors include: advanced age (>79men, >33 women);hypertension;male gender     Objective:    Today's Vitals   06/15/23 1050  Weight: 175 lb (79.4 kg)   Body mass index is 29.12 kg/m.     06/15/2023   10:53 AM 06/09/2022   11:01 AM 05/27/2021   11:06 AM 05/14/2020   11:06 AM  Advanced Directives  Does Patient Have a Medical Advance Directive? Yes Yes Yes Yes  Type of Estate agent of [place];Living will Healthcare Power of [place];Living will Living will Healthcare Power of [place];Living will  Does patient want to make changes to medical advance directive? No - Patient declined     Copy of Healthcare Power of Attorney in Chart? Yes - validated most recent copy scanned in chart (See row information) No - copy requested  No - copy requested    Current Medications (verified) Outpatient Encounter Medications as of 06/15/2023  Medication Sig   Multiple Vitamin (MULTIVITAMIN) tablet Take 1 tablet by mouth daily.     ramipril (ALTACE) 10 MG capsule TAKE 1  CAPSULE BY MOUTH DAILY   No facility-administered encounter medications on file as of 06/15/2023.    Allergies (verified) Penicillins   History: Past Medical History:  Diagnosis Date   Hypertension    Past Surgical History:  Procedure Laterality Date   none     Family History  Problem Relation Age of Onset   Diabetes Mother    Hypertension Mother    Heart disease Father        cabg x2, heavy smoker   Hyperlipidemia Father    Diabetes Father    Mental illness Father        dementia   Social History   Socioeconomic History   Marital status: Married    Spouse name: Not on file   Number of children: Not on file   Years of education: Not on file   Highest education level: Not on file  Occupational History   Occupation: CFO Research scientist (medical): [employer]    Comment: retired  Tobacco Use   Smoking status: Never   Smokeless tobacco: Never  Substance and Sexual Activity   Alcohol use: Yes    Comment: 6 beers a year   Drug use: No   Sexual activity: Not on file  Other Topics Concern   Not on file  Social History Narrative   Married 2nd wife. 2 kids - healthy. 3 grandkids, last born December 2015. 1 in [place] and 1 in [facility]   [employer] undergraduate.       Retired 2019-    [employer]  Hobbies: ride bike on [place], sports-[employer], [place] , grandkids, reading.       Social Determinants of Health   Financial Resource Strain: Low Risk  (06/11/2023)   Overall Financial Resource Strain (CARDIA)    Difficulty of Paying Living Expenses: Not hard at all  Food Insecurity: No Food Insecurity (06/11/2023)   Hunger Vital Sign    Worried About Running Out of Food in the Last Year: Never true    Ran Out of Food in the Last Year: Never true  Transportation Needs: No Transportation Needs (06/11/2023)   PRAPARE - Administrator, Civil Service (Medical): No    Lack of Transportation (Non-Medical): No  Physical Activity:  Sufficiently Active (06/11/2023)   Exercise Vital Sign    Days of Exercise per Week: 7 days    Minutes of Exercise per Session: 110 min  Stress: No Stress Concern Present (06/11/2023)   [employer] of Occupational Health - Occupational Stress Questionnaire    Feeling of Stress : Only a little  Social Connections: Moderately Integrated (06/11/2023)   Social Connection and Isolation Panel [NHANES]    Frequency of Communication with Friends and Family: Twice a week    Frequency of Social Gatherings with Friends and Family: Twice a week    Attends Religious Services: Never    Database administrator or Organizations: Yes    Attends Engineer, structural: 1 to 4 times per year    Marital Status: Married    Tobacco Counseling Counseling given: Not Answered   Clinical Intake:  Pre-visit preparation completed: Yes  Pain : No/denies pain     BMI - recorded: 29.12 Nutritional Status: BMI 25 -29 Overweight Diabetes: No  How often do you need to have someone help you when you read instructions, pamphlets, or other written materials from your doctor or pharmacy?: 1 - Never  Interpreter Needed?: No  Information entered by :: Lanier Ensign, LPN   Activities of Daily Living    06/11/2023   11:40 AM  In your present state of health, do you have any difficulty performing the following activities:  Hearing? 0  Vision? 0  Difficulty concentrating or making decisions? 0  Walking or climbing stairs? 0  Dressing or bathing? 0  Doing errands, shopping? 0  Preparing Food and eating ? N  Using the Toilet? N  In the past six months, have you accidently leaked urine? N  Do you have problems with loss of bowel control? N  Managing your Medications? N  Managing your Finances? N  Housekeeping or managing your Housekeeping? N    Patient Care Team: Shelva Majestic, MD as PCP - General (Family Medicine)  Indicate any recent Medical Services you may have received from other  than [facility] providers in the past year (date may be approximate).     Assessment:   This is a routine wellness examination for Douglas Delgado.  Hearing/Vision screen Hearing Screening - Comments:: Pt denies any hearing issues  Vision Screening - Comments:: Pt follows up with Dr Hyacinth Meeker at [facility] for annual eye exams    Goals Addressed             This Visit's Progress    Patient Stated       Maintain health and ACTIVITY        Depression Screen    06/15/2023   10:53 AM 11/04/2022    8:17 AM 06/09/2022   11:00 AM 05/27/2021   11:05 AM 05/14/2020  11:04 AM 05/10/2019    9:31 AM 02/08/2018    8:17 AM  PHQ 2/9 Scores  PHQ - 2 Score 0 0 0 0 0 0 0  PHQ- 9 Score  0         Fall Risk    06/11/2023   11:40 AM 06/09/2022   11:02 AM 06/05/2022    8:42 AM 05/27/2021   11:07 AM 05/14/2020   11:06 AM  Fall Risk   Falls in the past year? 0 0 0 0 0  Number falls in past yr: 0 0 0 0 0  Injury with Fall? 0 0 0 0 0  Risk for fall due to : No Fall Risks Impaired vision  Impaired vision Impaired vision  Follow up Falls prevention discussed Falls prevention discussed  Falls prevention discussed Falls prevention discussed    MEDICARE RISK AT HOME: Medicare Risk at Home Any stairs in or around the home?: Yes If so, are there any without handrails?: No Home free of loose throw rugs in walkways, pet beds, electrical cords, etc?: Yes Adequate lighting in your home to reduce risk of falls?: Yes Life alert?: No Use of a cane, walker or w/c?: No Grab bars in the bathroom?: No Shower chair or bench in shower?: No Elevated toilet seat or a handicapped toilet?: No  TIMED UP AND GO:  Was the test performed?  No    Cognitive Function:        06/15/2023   10:55 AM 06/09/2022   11:02 AM 05/27/2021   11:08 AM 05/14/2020   11:08 AM  6CIT Screen  What Year? 0 points 0 points 0 points 0 points  What month? 0 points 0 points 0 points 0 points  What time? 0 points 0 points 0 points   Count back  from 20 0 points 0 points 0 points 0 points  Months in reverse 0 points 0 points 0 points 0 points  Repeat phrase 0 points 0 points 0 points 0 points  Total Score 0 points 0 points 0 points     Immunizations Immunization History  Administered Date(s) Administered   Fluad Quad(high Dose 65+) 05/10/2019, 05/22/2020, 03/31/2022   Influenza Split 04/29/2011, 04/23/2012   Influenza Whole 03/21/2010   Influenza, High Dose Seasonal PF 03/13/2021, 03/31/2022, 04/14/2023   Influenza,inj,Quad PF,6+ Mos 04/15/2013, 04/20/2014, 05/02/2015, 04/23/2016, 04/09/2017, 04/15/2018   PFIZER Comirnaty(Gray Top)Covid-19 Tri-Sucrose Vaccine 03/31/2022, 09/29/2022   PFIZER(Purple Top)SARS-COV-2 Vaccination 08/11/2019, 09/05/2019, 04/02/2020, 03/13/2021   Pfizer Covid-19 Vaccine Bivalent Booster 49yrs & up 11/02/2021   Pfizer(Comirnaty)Fall Seasonal Vaccine 12 years and older 04/14/2023   Pneumococcal Conjugate-13 05/10/2019   Pneumococcal Polysaccharide-23 05/22/2020   Td 12/28/2007   Tdap 12/28/2007, 02/08/2018   Zoster Recombinant(Shingrix) 11/16/2017, 02/08/2018   Zoster, Live 06/28/2012    TDAP status: Up to date  Flu Vaccine status: Up to date  Pneumococcal vaccine status: Up to date  Covid-19 vaccine status: Information provided on how to obtain vaccines.   Qualifies for Shingles Vaccine? Yes   Zostavax completed Yes   Shingrix Completed?: Yes  Screening Tests Health Maintenance  Topic Date Due   COVID-19 Vaccine (9 - 2023-24 season) 08/15/2023   Fecal DNA (Cologuard)  04/17/2024   Medicare Annual Wellness (AWV)  06/14/2024   DTaP/Tdap/Td (4 - Td or Tdap) 02/09/2028   Pneumonia Vaccine 48+ Years old  Completed   INFLUENZA VACCINE  Completed   Hepatitis C Screening  Completed   Zoster Vaccines- Shingrix  Completed   HPV VACCINES  Aged  Out    Health Maintenance  There are no preventive care reminders to display for this patient.   Colorectal cancer screening: Type of screening:  Cologuard. Completed 04/17/21. Repeat every 3 years  Additional Screening:  Hepatitis C Screening: Completed 11/13/15  Vision Screening: Recommended annual ophthalmology exams for early detection of glaucoma and other disorders of the eye. Is the patient up to date with their annual eye exam?  Yes  Who is the provider or what is the name of the office in which the patient attends annual eye exams? [facility]  If pt is not established with a provider, would they like to be referred to a provider to establish care? No .   Dental Screening: Recommended annual dental exams for proper oral hygiene   Community Resource Referral / Chronic Care Management: CRR required this visit?  No   CCM required this visit?  No     Plan:     I have personally reviewed and noted the following in the patient's chart:   Medical and social history Use of alcohol, tobacco or illicit drugs  Current medications and supplements including opioid prescriptions. Patient is not currently taking opioid prescriptions. Functional ability and status Nutritional status Physical activity Advanced directives List of other physicians Hospitalizations, surgeries, and ER visits in previous 12 months Vitals Screenings to include cognitive, depression, and falls Referrals and appointments  In addition, I have reviewed and discussed with patient certain preventive protocols, quality metrics, and best practice recommendations. A written personalized care plan for preventive services as well as general preventive health recommendations were provided to patient.     Marzella Schlein, LPN   16/07/958   After Visit Summary: (MyChart) Due to this being a telephonic visit, the after visit summary with patients personalized plan was offered to patient via MyChart   Nurse Notes: none

## 2023-09-01 ENCOUNTER — Other Ambulatory Visit: Payer: Self-pay | Admitting: Family Medicine

## 2023-09-15 DIAGNOSIS — Z1211 Encounter for screening for malignant neoplasm of colon: Secondary | ICD-10-CM | POA: Diagnosis not present

## 2023-09-15 DIAGNOSIS — Z1212 Encounter for screening for malignant neoplasm of rectum: Secondary | ICD-10-CM | POA: Diagnosis not present

## 2023-09-20 LAB — COLOGUARD
COLOGUARD: NEGATIVE
Cologuard: NEGATIVE

## 2023-09-20 LAB — EXTERNAL GENERIC LAB PROCEDURE: COLOGUARD: NEGATIVE

## 2023-09-21 ENCOUNTER — Encounter: Payer: Self-pay | Admitting: Family Medicine

## 2023-11-09 ENCOUNTER — Ambulatory Visit (INDEPENDENT_AMBULATORY_CARE_PROVIDER_SITE_OTHER): Payer: Medicare Other | Admitting: Family Medicine

## 2023-11-09 ENCOUNTER — Encounter: Payer: Self-pay | Admitting: Family Medicine

## 2023-11-09 VITALS — BP 134/72 | HR 70 | Temp 99.0°F | Ht 66.0 in | Wt 176.4 lb

## 2023-11-09 DIAGNOSIS — Z125 Encounter for screening for malignant neoplasm of prostate: Secondary | ICD-10-CM

## 2023-11-09 DIAGNOSIS — Z131 Encounter for screening for diabetes mellitus: Secondary | ICD-10-CM

## 2023-11-09 DIAGNOSIS — R739 Hyperglycemia, unspecified: Secondary | ICD-10-CM

## 2023-11-09 DIAGNOSIS — Z8249 Family history of ischemic heart disease and other diseases of the circulatory system: Secondary | ICD-10-CM | POA: Diagnosis not present

## 2023-11-09 DIAGNOSIS — I10 Essential (primary) hypertension: Secondary | ICD-10-CM | POA: Diagnosis not present

## 2023-11-09 DIAGNOSIS — Z0000 Encounter for general adult medical examination without abnormal findings: Secondary | ICD-10-CM | POA: Diagnosis not present

## 2023-11-09 LAB — LIPID PANEL
Cholesterol: 93 mg/dL (ref 0–200)
HDL: 51.3 mg/dL (ref >39.00)
LDL Cholesterol: 34 mg/dL (ref 0–99)
NonHDL: 41.74
Total CHOL/HDL Ratio: 2
Triglycerides: 40 mg/dL (ref 0.0–149.0)
VLDL: 8 mg/dL (ref 0.0–40.0)

## 2023-11-09 LAB — CBC WITH DIFFERENTIAL/PLATELET
Basophils Absolute: 0 10*3/uL (ref 0.0–0.1)
Basophils Relative: 0.8 % (ref 0.0–3.0)
Eosinophils Absolute: 0.1 10*3/uL (ref 0.0–0.7)
Eosinophils Relative: 3.1 % (ref 0.0–5.0)
HCT: 43.7 % (ref 39.0–52.0)
Hemoglobin: 15.2 g/dL (ref 13.0–17.0)
Lymphocytes Relative: 27.2 % (ref 12.0–46.0)
Lymphs Abs: 1.2 10*3/uL (ref 0.7–4.0)
MCHC: 34.7 g/dL (ref 30.0–36.0)
MCV: 92 fl (ref 78.0–100.0)
Monocytes Absolute: 0.3 10*3/uL (ref 0.1–1.0)
Monocytes Relative: 7.4 % (ref 3.0–12.0)
Neutro Abs: 2.7 10*3/uL (ref 1.4–7.7)
Neutrophils Relative %: 61.5 % (ref 43.0–77.0)
Platelets: 186 10*3/uL (ref 150.0–400.0)
RBC: 4.75 Mil/uL (ref 4.22–5.81)
RDW: 13 % (ref 11.5–15.5)
WBC: 4.3 10*3/uL (ref 4.0–10.5)

## 2023-11-09 LAB — PSA, MEDICARE: PSA: 0.6 ng/mL (ref 0.10–4.00)

## 2023-11-09 LAB — COMPREHENSIVE METABOLIC PANEL WITH GFR
ALT: 25 U/L (ref 0–53)
AST: 22 U/L (ref 0–37)
Albumin: 4.4 g/dL (ref 3.5–5.2)
Alkaline Phosphatase: 57 U/L (ref 39–117)
BUN: 17 mg/dL (ref 6–23)
CO2: 24 meq/L (ref 19–32)
Calcium: 9.5 mg/dL (ref 8.4–10.5)
Chloride: 104 meq/L (ref 96–112)
Creatinine, Ser: 0.97 mg/dL (ref 0.40–1.50)
GFR: 79.64 mL/min (ref >60.00)
Glucose, Bld: 116 mg/dL — ABNORMAL HIGH (ref 70–99)
Potassium: 4.8 meq/L (ref 3.5–5.1)
Sodium: 139 meq/L (ref 135–145)
Total Bilirubin: 0.7 mg/dL (ref 0.2–1.2)
Total Protein: 7 g/dL (ref 6.0–8.3)

## 2023-11-09 LAB — HEMOGLOBIN A1C: Hgb A1c MFr Bld: 5.6 % (ref 4.6–6.5)

## 2023-11-09 MED ORDER — RAMIPRIL 10 MG PO CAPS: 10.0000 mg | ORAL_CAPSULE | Freq: Every day | ORAL | 3 refills | Status: DC

## 2023-11-09 NOTE — Progress Notes (Signed)
 Phone: [phone]   Subjective:  Patient presents today for their annual physical. Chief complaint-noted.   See problem oriented charting- ROS- full  review of systems was completed and negative  except for: seasonal allergies- allegra as needed   The following were reviewed and entered/updated in epic: Past Medical History:  Diagnosis Date   Hypertension    Patient Active Problem List   Diagnosis Date Noted   Hyperglycemia 11/20/2015    Priority: Medium    Essential hypertension 05/19/2007    Priority: Medium    Rash and nonspecific skin eruption 07/24/2014    Priority: Low   Erectile dysfunction 04/20/2014    Priority: Low   Testicular hypofunction 03/21/2010    Priority: Low   Past Surgical History:  Procedure Laterality Date   none      Family History  Problem Relation Age of Onset   Diabetes Mother    Hypertension Mother    Heart disease Father        cabg x2, heavy smoker   Hyperlipidemia Father    Diabetes Father    Mental illness Father        dementia    Medications- reviewed and updated Current Outpatient Medications  Medication Sig Dispense Refill   Multiple Vitamin (MULTIVITAMIN) tablet Take 1 tablet by mouth daily.       ramipril  (ALTACE ) 10 MG capsule TAKE 1 CAPSULE BY MOUTH DAILY 100 capsule 2   No current facility-administered medications for this visit.    Allergies-reviewed and updated Allergies  Allergen Reactions   Penicillins     REACTION: reaction when 68 years old    Social History   Social History Narrative   Married 2nd wife. 2 kids - healthy. 3 grandkids, last born December 2015. 1 in [place] and 1 in [facility]   [employer] undergraduate.       Retired 2019-    [employer]      Hobbies: ride bike on [place], sports-[employer] [place], [place] , grandkids, reading.       Objective  Objective:  BP 134/72 Comment: most recent home reading  Pulse 70   Temp 99 F (37.2 C) (Temporal)   Ht 5\' 6"  (1.676 m)   Wt 176 lb  6.4 oz (80 kg)   SpO2 96%   BMI 28.47 kg/m  Gen: NAD, resting comfortably HEENT: Mucous membranes are moist. Oropharynx normal Neck: no thyromegaly CV: RRR no murmurs rubs or gallops Lungs: CTAB no crackles, wheeze, rhonchi Abdomen: soft/nontender/nondistended/normal bowel sounds. No rebound or guarding.  Ext: no edema Skin: warm, dry Neuro: grossly normal, moves all extremities, PERRLA Declines genitourinary and rectal exam- no concerns   Assessment and Plan  68 y.o. male presenting for annual physical.  Health Maintenance counseling: 1. Anticipatory guidance: Patient counseled regarding regular dental exams -q3 months, eye exams -yearly with contacts,  avoiding smoking and second hand smoke , limiting alcohol to 2 beverages per day - maybe one a month, no illicit drugs.   2. Risk factor reduction:  Advised patient of need for regular exercise and diet rich and fruits and vegetables to reduce risk of heart attack and stroke.  Exercise- walks regularly 5 miles weather permitting- over 20k steps most days- also some afternoon walks. Could consider adding weight training Diet/weight management- weight stable from last year. Still trying to eat rather clean Wt Readings from Last 3 Encounters:  11/09/23 176 lb 6.4 oz (80 kg)  06/15/23 175 lb (79.4 kg)  11/04/22 175 lb  3.2 oz (79.5 kg)  3. Immunizations/screenings/ancillary studies- up to date on COVID as plans on yearly  Immunization History  Administered Date(s) Administered   Fluad Quad(high Dose 65+) 05/10/2019, 05/22/2020, 03/31/2022   Influenza Split 04/29/2011, 04/23/2012   Influenza Whole 03/21/2010   Influenza, High Dose Seasonal PF 03/13/2021, 03/31/2022, 04/14/2023   Influenza,inj,Quad PF,6+ Mos 04/15/2013, 04/20/2014, 05/02/2015, 04/23/2016, 04/09/2017, 04/15/2018   PFIZER Comirnaty(Gray Top)Covid-19 Tri-Sucrose Vaccine 03/31/2022, 09/29/2022   PFIZER(Purple Top)SARS-COV-2 Vaccination 08/11/2019, 09/05/2019, 04/02/2020,  03/13/2021   Pfizer Covid-19 Vaccine Bivalent Booster 38yrs & up 11/02/2021   Pfizer(Comirnaty)Fall Seasonal Vaccine 12 years and older 04/14/2023   Pneumococcal Conjugate-13 05/10/2019   Pneumococcal Polysaccharide-23 05/22/2020   Td 12/28/2007   Tdap 12/28/2007, 02/08/2018   Zoster Recombinant(Shingrix ) 11/16/2017, 02/08/2018   Zoster, Live 06/28/2012  4. Prostate cancer screening-  low risk prior trend- update psa today  . Up some last year but 2023 reading on low side compared to baseline Lab Results  Component Value Date   PSA 1.04 11/04/2022   PSA 0.58 09/13/2021   PSA 0.84 05/22/2020   5. Colon cancer screening -  cologuard 09/20/2023 with 3-year repeat planned 6. Skin cancer screening-sees dermatology specialists yearly around May typically. advised regular sunscreen use. Denies worrisome, changing, or new skin lesions.  7. Smoking associated screening (lung cancer screening, AAA screen 65-75, UA)-never smoker-no lung cancer screening needed 8. STD screening -monogamous so declined  Status of chronic or acute concerns   #hypertension-whitecoat element S: medication: Ramipril  10 mg Home readings #s: 134/72 yesterday BP Readings from Last 3 Encounters:  11/09/23 134/72  11/04/22 130/75  09/13/21 135/76  A/P: stable- continue current medicines      # Hyperglycemia/insulin resistance/prediabetes-fasting sugars high in the past but A1c not elevated S:  Medication: None Lab Results  Component Value Date   HGBA1C 5.5 11/04/2022   HGBA1C 5.2 05/22/2020   HGBA1C 5.4 02/08/2018  A/P: sugars high as noted in past fasting but a1c has been fine- check again today  # Family history of CAD in father around 36 but heavy smoker-we have discussed CT calcium scoring he wants to wait on this until around age 26-patient's personal lipids are excellent. Will get baseline lpa   -no atherosclerosis on 2008 CT angiogram chest  # Lipoma on left shoulder 5 x 5 cm still-not painful and not  enlarging and not stuck down   Recommended follow up: Return in about 1 year (around 11/08/2024) for physical or sooner if needed.Schedule b4 you leave. Future Appointments  Date Time Provider Department Center  06/21/2024 10:40 AM [facility] ANNUAL WELLNESS VISIT 1 [facility]   Lab/Order associations: fasting   ICD-10-CM   1. Preventative health care  Z00.00     2. Screening for diabetes mellitus  Z13.1     3. Hyperglycemia  R73.9     4. Essential hypertension  I10     5. Screening for prostate cancer  Z12.5     6. Family history of early CAD  Z82.49       No orders of the defined types were placed in this encounter.   Return precautions advised.  Clarisa Crooked, MD

## 2023-11-09 NOTE — Patient Instructions (Addendum)
 Please stop by lab before you go If you have mychart- we will send your results within 3 business days of us  receiving them.  If you do not have mychart- we will call you about results within 5 business days of us  receiving them.  *please also note that you will see labs on mychart as soon as they post. I will later go in and write notes on them- will say "notes from Dr. Arlene Ben"   Keep up the great job with healthy diet and regular exercise  Recommended follow up: Return in about 1 year (around 11/08/2024) for physical or sooner if needed.Schedule b4 you leave. As long as blood pressure remains in healthy range at home <135/85

## 2023-11-12 ENCOUNTER — Encounter: Payer: Self-pay | Admitting: Family Medicine

## 2023-11-12 LAB — LIPOPROTEIN A (LPA): Lipoprotein (a): 11 nmol/L (ref <75)

## 2023-11-20 ENCOUNTER — Encounter: Payer: Self-pay | Admitting: Family Medicine

## 2023-11-23 ENCOUNTER — Other Ambulatory Visit: Payer: Self-pay

## 2023-11-23 MED ORDER — RAMIPRIL 10 MG PO CAPS: 10.0000 mg | ORAL_CAPSULE | Freq: Every day | ORAL | 3 refills | Status: AC

## 2024-03-17 DIAGNOSIS — L821 Other seborrheic keratosis: Secondary | ICD-10-CM | POA: Diagnosis not present

## 2024-03-17 DIAGNOSIS — L570 Actinic keratosis: Secondary | ICD-10-CM | POA: Diagnosis not present

## 2024-03-17 DIAGNOSIS — D2239 Melanocytic nevi of other parts of face: Secondary | ICD-10-CM | POA: Diagnosis not present

## 2024-04-27 ENCOUNTER — Encounter: Payer: Self-pay | Admitting: Family Medicine

## 2024-05-11 ENCOUNTER — Telehealth: Payer: Self-pay | Admitting: Pharmacist

## 2024-05-11 NOTE — Progress Notes (Signed)
 Pharmacy Quality Measure Review  This patient is appearing on a report for being at risk of failing the adherence measure for hypertension (ACEi/ARB) medications this calendar year.   Medication: ramipril  Last fill date: 01/29/2024 for 90 day supply  Reviewed recent refill history in Dr Annemarie database but there was not records found. Per Epic records actual last refill date was 05/04/2024 for 100 day supply.   Spoke with Mr. Lieurance and he verified he received ramipril  from [facility] this week. Patient has 1 refill remaining. Next appointment with PCP is 11/2024.    Insurance report was not up to date. No action needed at this time.   Madelin Ray, PharmD Clinical Pharmacist [facility]  Population Health [phone]

## 2024-06-21 ENCOUNTER — Ambulatory Visit: Payer: Medicare Other

## 2024-06-21 VITALS — BP 133/73 | Wt 176.0 lb

## 2024-06-21 DIAGNOSIS — Z0000 Encounter for general adult medical examination without abnormal findings: Secondary | ICD-10-CM

## 2024-06-21 NOTE — Patient Instructions (Signed)
 Douglas Delgado,  Thank you for taking the time for your Medicare Wellness Visit. I appreciate your continued commitment to your health goals. Please review the care plan we discussed, and feel free to reach out if I can assist you further.  Please note that Annual Wellness Visits do not include a physical exam. Some assessments may be limited, especially if the visit was conducted virtually. If needed, we may recommend an in-person follow-up with your provider.  Ongoing Care Seeing your primary care provider every 3 to 6 months helps us  monitor your health and provide consistent, personalized care.   Referrals If a referral was made during today's visit and you haven't received any updates within two weeks, please contact the referred provider directly to check on the status.  Recommended Screenings:  Health Maintenance  Topic Date Due   COVID-19 Vaccine (10 - Pfizer risk 2025-26 season) 10/26/2024   Medicare Annual Wellness Visit  06/21/2025   Cologuard (Stool DNA test)  09/20/2026   DTaP/Tdap/Td vaccine (4 - Td or Tdap) 02/09/2028   Pneumococcal Vaccine for age over 22  Completed   Flu Shot  Completed   Hepatitis C Screening  Completed   Zoster (Shingles) Vaccine  Completed   Meningitis B Vaccine  Aged Out       06/17/2024   11:50 AM  Advanced Directives  Does Patient Have a Medical Advance Directive? Yes  Type of Advance Directive Healthcare Power of Attorney  Copy of Healthcare Power of Attorney in Chart? Yes - validated most recent copy scanned in chart (See row information)    Vision: Annual vision screenings are recommended for early detection of glaucoma, cataracts, and diabetic retinopathy. These exams can also reveal signs of chronic conditions such as diabetes and high blood pressure.  Dental: Annual dental screenings help detect early signs of oral cancer, gum disease, and other conditions linked to overall health, including heart disease and diabetes.  Please see the  attached documents for additional preventive care recommendations.

## 2024-06-21 NOTE — Progress Notes (Signed)
 Chief Complaint  Patient presents with   Medicare Wellness     Subjective:   Douglas Delgado is a 68 y.o. male who presents for a Medicare Annual Wellness Visit.  Visit info / Clinical Intake: Medicare Wellness Visit Type:: Subsequent Annual Wellness Visit Persons participating in visit and providing information:: patient Medicare Wellness Visit Mode:: Telephone If telephone:: video declined Since this visit was completed virtually, some vitals may be partially provided or unavailable. Missing vitals are due to the limitations of the virtual format.: Documented vitals are patient reported If Telephone or Video please confirm:: I connected with patient using audio/video enable telemedicine. I verified patient identity with two identifiers, discussed telehealth limitations, and patient agreed to proceed. Patient Location:: home Provider Location:: office Interpreter Needed?: No Pre-visit prep was completed: yes AWV questionnaire completed by patient prior to visit?: yes Date:: 06/17/24 Living arrangements:: (Patient-Rptd) lives with spouse/significant other Patient's Overall Health Status Rating: (Patient-Rptd) excellent Typical amount of pain: (Patient-Rptd) none Does pain affect daily life?: (Patient-Rptd) no Are you currently prescribed opioids?: no  Dietary Habits and Nutritional Risks How many meals a day?: (Patient-Rptd) 3 Eats fruit and vegetables daily?: (Patient-Rptd) yes Most meals are obtained by: (Patient-Rptd) preparing own meals In the last 2 weeks, have you had any of the following?: none Diabetic:: no  Functional Status Activities of Daily Living (to include ambulation/medication): (Patient-Rptd) Independent Ambulation: Independent with device- listed below Home Assistive Devices/Equipment: Contact lenses; Eyeglasses Medication Administration: (Patient-Rptd) Independent Home Management (perform basic housework or laundry): (Patient-Rptd) Independent Manage your  own finances?: (Patient-Rptd) yes Primary transportation is: (Patient-Rptd) driving  Fall Screening Falls in the past year?: (Patient-Rptd) 0 Number of falls in past year: 0 Was there an injury with Fall?: 0 Fall Risk Category Calculator: 0 Patient Fall Risk Level: Low Fall Risk  Fall Risk Patient at Risk for Falls Due to: No Fall Risks Fall risk Follow up: Falls prevention discussed  Home and Transportation Safety: All rugs have non-skid backing?: (Patient-Rptd) yes All stairs or steps have railings?: (Patient-Rptd) yes Grab bars in the bathtub or shower?: (!) (Patient-Rptd) no Have non-skid surface in bathtub or shower?: (!) (Patient-Rptd) no Good home lighting?: (Patient-Rptd) yes Regular seat belt use?: (Patient-Rptd) yes Hospital stays in the last year:: (Patient-Rptd) no  Cognitive Assessment Difficulty concentrating, remembering, or making decisions? : (Patient-Rptd) no Will 6CIT or Mini Cog be Completed: no 6CIT or Mini Cog Declined: patient alert, oriented, able to answer questions appropriately and recall recent events  Advance Directives (For Healthcare) Does Patient Have a Medical Advance Directive?: Yes Type of Advance Directive: Healthcare Power of Attorney Copy of Healthcare Power of Attorney in Chart?: Yes - validated most recent copy scanned in chart (See row information)  Reviewed/Updated  Reviewed/Updated: Reviewed All (Medical, Surgical, Family, Medications, Allergies, Care Teams, Patient Goals)    Allergies (verified) Penicillins   Current Medications (verified) Outpatient Encounter Medications as of 06/21/2024  Medication Sig   Multiple Vitamin (MULTIVITAMIN) tablet Take 1 tablet by mouth daily.     ramipril  (ALTACE ) 10 MG capsule Take 1 capsule (10 mg total) by mouth daily.   No facility-administered encounter medications on file as of 06/21/2024.    History: Past Medical History:  Diagnosis Date   Hypertension    Past Surgical History:   Procedure Laterality Date   none     Family History  Problem Relation Age of Onset   Diabetes Mother    Hypertension Mother    Heart disease Father  cabg x2, heavy smoker   Hyperlipidemia Father    Diabetes Father    Mental illness Father        dementia   Social History   Occupational History   Occupation: CFO Research Scientist (medical): [employer]    Comment: retired  Tobacco Use   Smoking status: Never   Smokeless tobacco: Never  Substance and Sexual Activity   Alcohol use: Not Currently    Comment: Occassional   Drug use: Never   Sexual activity: Yes    Birth control/protection: None   Tobacco Counseling Counseling given: Not Answered  SDOH Screenings   Food Insecurity: No Food Insecurity (06/17/2024)  Housing: Low Risk (06/17/2024)  Transportation Needs: No Transportation Needs (06/17/2024)  Utilities: Not At Risk (06/21/2024)  Alcohol Screen: Low Risk (06/17/2024)  Depression (PHQ2-9): Low Risk (06/21/2024)  Financial Resource Strain: Low Risk (06/17/2024)  Physical Activity: Sufficiently Active (06/17/2024)  Social Connections: Moderately Isolated (06/17/2024)  Stress: No Stress Concern Present (06/17/2024)  Tobacco Use: Low Risk (06/21/2024)  Health Literacy: Adequate Health Literacy (06/21/2024)   See flowsheets for full screening details  Depression Screen PHQ 2 & 9 Depression Scale- Over the past 2 weeks, how often have you been bothered by any of the following problems? Little interest or pleasure in doing things: 0 Feeling down, depressed, or hopeless (PHQ Adolescent also includes...irritable): 0 PHQ-2 Total Score: 0     Goals Addressed               This Visit's Progress     stay active and keep walking (pt-stated)        Stay active and keep walking              Objective:    Today's Vitals   06/21/24 1041  BP: 133/73  Weight: 176 lb (79.8 kg)   Body mass index is 28.41 kg/m.  Hearing/Vision  screen Hearing Screening - Comments:: Pt denies any hearing issues  Vision Screening - Comments:: Wears rx glasses - up to date with routine eye exams with  Dr cleotilde  Immunizations and Health Maintenance Health Maintenance  Topic Date Due   COVID-19 Vaccine (10 - Pfizer risk 2025-26 season) 10/26/2024   Medicare Annual Wellness (AWV)  06/21/2025   Fecal DNA (Cologuard)  09/20/2026   DTaP/Tdap/Td (4 - Td or Tdap) 02/09/2028   Pneumococcal Vaccine: 50+ Years  Completed   Influenza Vaccine  Completed   Hepatitis C Screening  Completed   Zoster Vaccines- Shingrix   Completed   Meningococcal B Vaccine  Aged Out        Assessment/Plan:  This is a routine wellness examination for [name].  Patient Care Team: Katrinka Garnette KIDD, MD as PCP - General (Family Medicine)  I have personally reviewed and noted the following in the patients chart:   Medical and social history Use of alcohol, tobacco or illicit drugs  Current medications and supplements including opioid prescriptions. Functional ability and status Nutritional status Physical activity Advanced directives List of other physicians Hospitalizations, surgeries, and ER visits in previous 12 months Vitals Screenings to include cognitive, depression, and falls Referrals and appointments  No orders of the defined types were placed in this encounter.  In addition, I have reviewed and discussed with patient certain preventive protocols, quality metrics, and best practice recommendations. A written personalized care plan for preventive services as well as general preventive health recommendations were provided to patient.   Ellouise VEAR Haws, LPN   [id number]/83/[id number]  Return in 1 year (on 06/26/2025).  After Visit Summary: (MyChart) Due to this being a telephonic visit, the after visit summary with patients personalized plan was offered to patient via MyChart   Nurse Notes: No voiced or noted concerns at this time

## 2024-11-09 ENCOUNTER — Encounter: Admitting: Family Medicine

## 2025-06-26 ENCOUNTER — Ambulatory Visit
